# Patient Record
Sex: Female | Born: 1970 | Race: White | Hispanic: No | Marital: Married | Smoking: Never smoker
Health system: Southern US, Community
[De-identification: ages and names within clinical notes are randomized; demographics above are authoritative.]

## PROBLEM LIST (undated history)

## (undated) DIAGNOSIS — Z973 Presence of spectacles and contact lenses: Secondary | ICD-10-CM

## (undated) DIAGNOSIS — Z8659 Personal history of other mental and behavioral disorders: Secondary | ICD-10-CM

## (undated) DIAGNOSIS — N950 Postmenopausal bleeding: Secondary | ICD-10-CM

## (undated) DIAGNOSIS — S060XAA Concussion with loss of consciousness status unknown, initial encounter: Secondary | ICD-10-CM

## (undated) DIAGNOSIS — K219 Gastro-esophageal reflux disease without esophagitis: Secondary | ICD-10-CM

## (undated) DIAGNOSIS — I10 Essential (primary) hypertension: Secondary | ICD-10-CM

## (undated) DIAGNOSIS — F411 Generalized anxiety disorder: Secondary | ICD-10-CM

## (undated) DIAGNOSIS — F419 Anxiety disorder, unspecified: Secondary | ICD-10-CM

## (undated) HISTORY — DX: Concussion with loss of consciousness status unknown, initial encounter: S06.0XAA

## (undated) HISTORY — DX: Gastro-esophageal reflux disease without esophagitis: K21.9

## (undated) HISTORY — PX: KNEE SURGERY: SHX244

## (undated) HISTORY — PX: LUMBAR MICRODISCECTOMY: SHX99

## (undated) HISTORY — DX: Essential (primary) hypertension: I10

## (undated) HISTORY — PX: EYE SURGERY: SHX253

## (undated) HISTORY — PX: OTHER SURGICAL HISTORY: SHX169

## (undated) HISTORY — PX: WISDOM TOOTH EXTRACTION: SHX21

---

## 2010-12-20 ENCOUNTER — Encounter: Payer: Self-pay | Admitting: Emergency Medicine

## 2010-12-20 ENCOUNTER — Ambulatory Visit (INDEPENDENT_AMBULATORY_CARE_PROVIDER_SITE_OTHER): Payer: Self-pay | Admitting: Emergency Medicine

## 2010-12-20 DIAGNOSIS — J069 Acute upper respiratory infection, unspecified: Secondary | ICD-10-CM

## 2010-12-24 NOTE — Assessment & Plan Note (Signed)
Summary: SORE THROAT/ POSSIBLE STREP? (rm 4)   Vital Signs:  Patient Profile:   40 Years Old Female CC:      sore throat x yesterday Height:     61 inches Weight:      128 pounds O2 Sat:      98 % O2 treatment:    Room Air Temp:     99.6 degrees F oral Pulse rate:   111 / minute Resp:     18 per minute BP sitting:   122 / 82  (left arm) Cuff size:   regular  Vitals Entered By: Lajean Saver RN (December 20, 2010 11:52 AM)                  Updated Prior Medication List: No Medications Current Allergies: No known allergies History of Present Illness Chief Complaint: sore throat x yesterday History of Present Illness: 40 Years Old Female complains of onset of cold symptoms for 2 days, but has had a cold for a few weeks.  Was on Amoxil x10 days and stopped 1 week ago.  STEPHAINE has been using Zicam and other OTC cold meds which is helping a little bit. ++ sore throat (main complaint) No cough No pleuritic pain No wheezing No nasal congestion + post-nasal drainage No sinus pain/pressure No chest congestion No itchy/red eyes No earache No hemoptysis No SOB No chills/sweats No fever No nausea No vomiting No abdominal pain No diarrhea No skin rashes No fatigue No myalgias No headache   REVIEW OF SYSTEMS Constitutional Symptoms      Denies fever, chills, night sweats, weight loss, weight gain, and fatigue.  Eyes       Denies change in vision, eye pain, eye discharge, glasses, contact lenses, and eye surgery. Ear/Nose/Throat/Mouth       Complains of sore throat.      Denies hearing loss/aids, change in hearing, ear pain, ear discharge, dizziness, frequent runny nose, frequent nose bleeds, sinus problems, hoarseness, and tooth pain or bleeding.  Respiratory       Complains of dry cough.      Denies productive cough, wheezing, shortness of breath, asthma, bronchitis, and emphysema/COPD.  Cardiovascular       Denies murmurs, chest pain, and tires easily with  exhertion.    Gastrointestinal       Denies stomach pain, nausea/vomiting, diarrhea, constipation, blood in bowel movements, and indigestion. Genitourniary       Denies painful urination, kidney stones, and loss of urinary control. Neurological       Denies paralysis, seizures, and fainting/blackouts. Musculoskeletal       Denies muscle pain, joint pain, joint stiffness, decreased range of motion, redness, swelling, muscle weakness, and gout.  Skin       Denies bruising, unusual mles/lumps or sores, and hair/skin or nail changes.  Psych       Denies mood changes, temper/anger issues, anxiety/stress, speech problems, depression, and sleep problems.  Past History:  Past Medical History: recent bronchial infection  Past Surgical History: microdiscectomy   Social History: Never Smoked Alcohol use-yes Drug use-no Smoking Status:  never Drug Use:  no Physical Exam General appearance: well developed, well nourished, no acute distress, hoarse Ears: normal, no lesions or deformities Nasal: mucosa pink, nonedematous, no septal deviation, turbinates normal Oral/Pharynx: pharyngeal erythema without exudate, uvula midline without deviation.  Bil tonsils enlargement, OP patent Neck: ant cerv LAD tender Bil Thyroid: no nodules, masses, tenderness, or enlargement Chest/Lungs: no rales, wheezes, or rhonchi bilateral,  breath sounds equal without effort MSE: oriented to time, place, and person Assessment New Problems: PHARYNGITIS, ACUTE (ICD-462.0) UPPER RESPIRATORY INFECTION, ACUTE (ICD-465.9)   Patient Education: Patient and/or caregiver instructed in the following: rest, fluids.  Plan New Medications/Changes: BACTRIM DS 800-160 MG TABS (SULFAMETHOXAZOLE-TRIMETHOPRIM) 1 by mouth two times a day for 7 days  #14 x 0, 12/20/2010, Hoyt Koch MD PREDNISONE (PAK) 10 MG TABS (PREDNISONE) 6 day pack, use as directed  #1 x 0, 12/20/2010, Hoyt Koch MD  New Orders: New Patient  Level I [99201] Rapid Strep [82956] Planning Comments:   1)  Take the prescribed antibiotic as instructed.  Wait for 2 days to see if the Prednisone helps.  If not better, start the Bactrim. 2)  Use nasal saline solution (over the counter) at least 3 times a day. 3)  Use over the counter decongestants like Zyrtec-D every 12 hours as needed to help with congestion. 4)  Can take tylenol every 6 hours or motrin every 8 hours for pain or fever. 5)  Follow up with your primary doctor  if no improvement in 5-7 days, sooner if increasing pain, fever, or new symptoms.    The patient and/or caregiver has been counseled thoroughly with regard to medications prescribed including dosage, schedule, interactions, rationale for use, and possible side effects and they verbalize understanding.  Diagnoses and expected course of recovery discussed and will return if not improved as expected or if the condition worsens. Patient and/or caregiver verbalized understanding.  Prescriptions: BACTRIM DS 800-160 MG TABS (SULFAMETHOXAZOLE-TRIMETHOPRIM) 1 by mouth two times a day for 7 days  #14 x 0   Entered and Authorized by:   Hoyt Koch MD   Signed by:   Hoyt Koch MD on 12/20/2010   Method used:   Print then Give to Patient   RxID:   [id number] PREDNISONE (PAK) 10 MG TABS (PREDNISONE) 6 day pack, use as directed  #1 x 0   Entered and Authorized by:   Hoyt Koch MD   Signed by:   Hoyt Koch MD on 12/20/2010   Method used:   Print then Give to Patient   RxID:   [id number]   Orders Added: 1)  New Patient Level I [99201] 2)  Rapid Strep [[id number]]    Laboratory Results  Date/Time Received: December 20, 2010 11:54 AM  Date/Time Reported: December 20, 2010 11:54 AM   Other Tests  Rapid Strep: negative  Kit Test Internal QC: Negative   (Normal Range: Negative)

## 2015-08-07 DIAGNOSIS — Z87828 Personal history of other (healed) physical injury and trauma: Secondary | ICD-10-CM | POA: Insufficient documentation

## 2015-08-07 HISTORY — DX: Personal history of other (healed) physical injury and trauma: Z87.828

## 2015-11-14 HISTORY — PX: KNEE ARTHROSCOPY W/ ACL RECONSTRUCTION: SHX1858

## 2017-06-02 DIAGNOSIS — N83201 Unspecified ovarian cyst, right side: Secondary | ICD-10-CM | POA: Insufficient documentation

## 2018-08-26 ENCOUNTER — Other Ambulatory Visit: Payer: Self-pay

## 2018-08-26 ENCOUNTER — Emergency Department (INDEPENDENT_AMBULATORY_CARE_PROVIDER_SITE_OTHER)
Admission: EM | Admit: 2018-08-26 | Discharge: 2018-08-26 | Disposition: A | Discharge status: Home / Self Care | Payer: 59 | Source: Home / Self Care | Attending: Family Medicine | Admitting: Family Medicine

## 2018-08-26 ENCOUNTER — Telehealth: Payer: Self-pay

## 2018-08-26 ENCOUNTER — Emergency Department (INDEPENDENT_AMBULATORY_CARE_PROVIDER_SITE_OTHER): Payer: 59

## 2018-08-26 DIAGNOSIS — R1011 Right upper quadrant pain: Secondary | ICD-10-CM

## 2018-08-26 DIAGNOSIS — R93422 Abnormal radiologic findings on diagnostic imaging of left kidney: Secondary | ICD-10-CM

## 2018-08-26 DIAGNOSIS — R1012 Left upper quadrant pain: Secondary | ICD-10-CM | POA: Diagnosis not present

## 2018-08-26 LAB — POCT URINALYSIS DIP (MANUAL ENTRY)
BILIRUBIN UA: NEGATIVE mg/dL
Bilirubin, UA: NEGATIVE
Glucose, UA: NEGATIVE mg/dL
LEUKOCYTES UA: NEGATIVE
NITRITE UA: NEGATIVE
PH UA: 5.5 (ref 5.0–8.0)
PROTEIN UA: NEGATIVE mg/dL
Spec Grav, UA: 1.015 (ref 1.010–1.025)
UROBILINOGEN UA: 0.2 U/dL

## 2018-08-26 LAB — AMYLASE: Amylase: 51 U/L (ref 21–101)

## 2018-08-26 LAB — CBC WITH DIFFERENTIAL/PLATELET
BASOS ABS: 90 {cells}/uL (ref 0–200)
Basophils Relative: 0.7 %
EOS PCT: 13.8 %
Eosinophils Absolute: 1766 cells/uL — ABNORMAL HIGH (ref 15–500)
HEMATOCRIT: 43.9 % (ref 35.0–45.0)
Hemoglobin: 14.8 g/dL (ref 11.7–15.5)
LYMPHS ABS: 2829 {cells}/uL (ref 850–3900)
MCH: 30.6 pg (ref 27.0–33.0)
MCHC: 33.7 g/dL (ref 32.0–36.0)
MCV: 90.9 fL (ref 80.0–100.0)
MPV: 10.5 fL (ref 7.5–12.5)
Monocytes Relative: 4.4 %
NEUTROS PCT: 59 %
Neutro Abs: 7552 cells/uL (ref 1500–7800)
Platelets: 293 10*3/uL (ref 140–400)
RBC: 4.83 10*6/uL (ref 3.80–5.10)
RDW: 12.8 % (ref 11.0–15.0)
Total Lymphocyte: 22.1 %
WBC mixed population: 563 cells/uL (ref 200–950)
WBC: 12.8 10*3/uL — ABNORMAL HIGH (ref 3.8–10.8)

## 2018-08-26 LAB — COMPLETE METABOLIC PANEL WITH GFR
AG RATIO: 1.6 (calc) (ref 1.0–2.5)
ALT: 17 U/L (ref 6–29)
AST: 21 U/L (ref 10–35)
Albumin: 4.3 g/dL (ref 3.6–5.1)
Alkaline phosphatase (APISO): 72 U/L (ref 33–115)
BUN: 12 mg/dL (ref 7–25)
CALCIUM: 9.5 mg/dL (ref 8.6–10.2)
CO2: 26 mmol/L (ref 20–32)
Chloride: 101 mmol/L (ref 98–110)
Creat: 0.63 mg/dL (ref 0.50–1.10)
GFR, EST NON AFRICAN AMERICAN: 108 mL/min/{1.73_m2} (ref >=60)
GFR, Est African American: 126 mL/min/{1.73_m2} (ref >=60)
GLUCOSE: 93 mg/dL (ref 65–99)
Globulin: 2.7 g/dL (calc) (ref 1.9–3.7)
POTASSIUM: 4 mmol/L (ref 3.5–5.3)
Sodium: 138 mmol/L (ref 135–146)
Total Bilirubin: 0.4 mg/dL (ref 0.2–1.2)
Total Protein: 7 g/dL (ref 6.1–8.1)

## 2018-08-26 LAB — LIPASE: LIPASE: 19 U/L (ref 7–60)

## 2018-08-26 MED ORDER — METRONIDAZOLE 500 MG PO TABS: ORAL_TABLET | ORAL | 0 refills | Status: DC

## 2018-08-26 MED ORDER — CIPROFLOXACIN HCL 500 MG PO TABS: ORAL_TABLET | ORAL | 0 refills | Status: DC

## 2018-08-26 NOTE — ED Provider Notes (Signed)
[facility]    CSN: [id number] Arrival date & time: 08/26/18  1054     History   Chief Complaint Chief Complaint  Patient presents with  . Abdominal Pain    HPI Patricia Webb is a 48 y.o. female.   At about 8pm yesterday, one hour after eating pizza, patient developed mid abdominal pain that radiated to her lower back.  She described the pain as sharp, constant and somewhat colicky.  She took some Prilosec OTC and PeptoBismol with slight relief.  The pain is still present but has improved.  She has had mild nausea without vomiting.  She has had chills but no fever.  No urinary symptoms.  Bowel movements have been normal.  She has had vaginal spotting for about 9 days (she has the Mirena IUD for contraception), and normally has 4 periods/year.  She is concerned that she may have gallstones.  The history is provided by the patient.  Abdominal Pain  Pain location:  Epigastric Pain quality: sharp   Pain radiates to:  Back Pain severity:  Moderate Onset quality:  Sudden Duration:  12 hours Timing:  Constant Progression:  Improving Chronicity:  New Context: eating   Relieved by:  Nothing Worsened by:  Nothing Ineffective treatments:  OTC medications Associated symptoms: chills and nausea   Associated symptoms: no anorexia, no belching, no chest pain, no constipation, no cough, no diarrhea, no dysuria, no fatigue, no fever, no flatus, no hematemesis, no hematochezia, no hematuria, no melena, no shortness of breath and no vomiting     History reviewed. No pertinent past medical history.  There are no active problems to display for this patient.   Past Surgical History:  Procedure Laterality Date  . KNEE SURGERY Left   . microdiscectomy Bilateral     OB History   None      Home Medications    Prior to Admission medications   Medication Sig Start Date End Date Taking? Authorizing Provider  levonorgestrel (MIRENA) 20 MCG/24HR IUD 1 each by  Intrauterine route once.   Yes [provider]    Family History History reviewed. No pertinent family history.  Social History Social History   Tobacco Use  . Smoking status: Never Smoker  . Smokeless tobacco: Never Used  Substance Use Topics  . Alcohol use: Yes    Alcohol/week: 7.0 standard drinks    Types: 7 Glasses of wine per week  . Drug use: Never     Allergies   Patient has no known allergies.   Review of Systems Review of Systems  Constitutional: Positive for chills. Negative for fatigue and fever.  Respiratory: Negative for cough and shortness of breath.   Cardiovascular: Negative for chest pain.  Gastrointestinal: Positive for abdominal pain and nausea. Negative for anorexia, constipation, diarrhea, flatus, hematemesis, hematochezia, melena and vomiting.  Genitourinary: Negative for dysuria and hematuria.  All other systems reviewed and are negative.    Physical Exam Triage Vital Signs ED Triage Vitals  Enc Vitals Group     BP 08/26/18 1207 (!) 161/108     Pulse Rate 08/26/18 1207 71     Resp --      Temp 08/26/18 1207 98.3 F (36.8 C)     Temp Source 08/26/18 1207 Oral     SpO2 --      Weight 08/26/18 1209 158 lb (71.7 kg)     Height 08/26/18 1209 5\' 2"  (1.575 m)     Head Circumference --  Peak Flow --      Pain Score 08/26/18 1208 3     Pain Loc --      Pain Edu? --      Excl. in GC? --    No data found.  Updated Vital Signs BP (!) 161/108 (BP Location: Right Arm)   Pulse 71   Temp 98.3 F (36.8 C) (Oral)   Ht 5\' 2"  (1.575 m)   Wt 71.7 kg   BMI 28.90 kg/m   Visual Acuity Right Eye Distance:   Left Eye Distance:   Bilateral Distance:    Right Eye Near:   Left Eye Near:    Bilateral Near:     Physical Exam  Constitutional: She appears well-developed and well-nourished. She does not appear ill. No distress.  HENT:  Head: Normocephalic.  Right Ear: External ear normal.  Left Ear: External ear normal.  Nose: Nose  normal.  Mouth/Throat: Oropharynx is clear and moist.  Eyes: Pupils are equal, round, and reactive to light. Conjunctivae are normal.  Neck: Neck supple.  Cardiovascular: Normal heart sounds.  Pulmonary/Chest: Breath sounds normal.  Abdominal: Soft. Normal appearance and bowel sounds are normal. She exhibits no mass. There is no hepatosplenomegaly. There is tenderness in the right upper quadrant, periumbilical area and left upper quadrant. There is no rigidity, no guarding, no CVA tenderness, no tenderness at McBurney's point and negative Murphy's sign.    Vague abdominal tenderness to palpation as noted on diagram.   Musculoskeletal: She exhibits no edema.  Lymphadenopathy:    She has no cervical adenopathy.  Neurological: She is alert.  Skin: Skin is warm and dry. No rash noted.  Nursing note and vitals reviewed.    UC Treatments / Results  Labs (all labs ordered are listed, but only abnormal results are displayed) Labs Reviewed  POCT URINALYSIS DIP (MANUAL ENTRY) - Abnormal; Notable for the following components:      Result Value   Clarity, UA cloudy (*)    Blood, UA small (*)    All other components within normal limits  COMPLETE METABOLIC PANEL WITH GFR  AMYLASE  LIPASE  CBC WITH DIFFERENTIAL/PLATELET  POCT CBC W AUTO DIFF ([facility])    EKG None  Radiology No results found.  Procedures Procedures (including critical care time)  Medications Ordered in UC Medications - No data to display  Initial Impression / Assessment and Plan / UC Course  I have reviewed the triage vital signs and the nursing notes.  Pertinent labs & imaging results that were available during my care of the patient were reviewed by me and considered in my medical decision making (see chart for details).     Abdominal US shows no gallstones or gallbladder wall thickening.  Note that left kidney smaller than right which could represent renal artery stenosis in light of her elevated  BP.  Recommend follow-up with PCP for further evaluation of BP.  CMP, amylase, lipase pending.   Patient left clinic after ultrasound, and before CBC results available.  Mild leukocytosis (WBC 12.8) suggests possible diverticulitis.   Will begin empiric Flagyl and Cipro.  Patient instructed to begin clear liquid diet for about 24 hours, then slowly advance. She should follow-up with PCP for further evaluation. If symptoms become significantly worse during the night or over the weekend, proceed to the local emergency room.   Final Clinical Impressions(s) / UC Diagnoses   Final diagnoses:  Left upper quadrant pain  Right upper quadrant abdominal pain  Discharge Instructions   None    ED Prescriptions    None         Lattie Haw, MD 08/26/18 [phone]

## 2018-08-26 NOTE — ED Triage Notes (Signed)
Pt presents with mid abdominal pain radiating to Bilateral posterior back. She describes pain as sharp 3/10 scale and it started last night after eating pizza. Mrs. Borski took otc omeprazole and peptobismol with slight relief. Upon arriving at work this morning the pain has worsened and she decided to come to [facility]. Of note, pt has some vaginal spotting for 9 days due to Mirena [facility]), she normally has 4 periods/year.

## 2018-08-26 NOTE — Telephone Encounter (Signed)
Pt called to ask for results of [place] and labs.  Dr Cathren Harsh wants her on clear liquids x 24 hours then progress to a BRAT diet for another 24 hours.  Slowly progress to a normal diet as tolerated.  Dr Cathren Harsh will send in medication for possible diverticulitis, and instructions if she becomes significantly worse overnight or this weekend to proceed to ER for scan.  Needs to follow up with a primary care provider early next week.

## 2018-08-26 NOTE — ED Triage Notes (Signed)
Pt had to leave before U/S resulted due to another appointment. Dr. Cathren Harsh will contact pt with results later. Pt was instructed to follow with a light diet and continue Prilosec.

## 2018-08-26 NOTE — Discharge Instructions (Addendum)
May continue Prilosec for now.

## 2018-08-27 ENCOUNTER — Telehealth: Payer: Self-pay

## 2018-08-27 NOTE — Telephone Encounter (Signed)
Pt called asking about bloodwork results. Informed of slight abnormal CBC. Encouraged to finish antibiotics and establish with PCP for further eval.

## 2018-08-29 ENCOUNTER — Telehealth: Payer: Self-pay | Admitting: Emergency Medicine

## 2018-08-29 NOTE — Telephone Encounter (Signed)
Attempted to contact x1. Left message to return call. 

## 2018-09-09 ENCOUNTER — Ambulatory Visit (INDEPENDENT_AMBULATORY_CARE_PROVIDER_SITE_OTHER): Payer: 59 | Admitting: Osteopathic Medicine

## 2018-09-09 ENCOUNTER — Encounter: Payer: Self-pay | Admitting: Osteopathic Medicine

## 2018-09-09 VITALS — BP 142/85 | HR 78 | Temp 98.6°F | Ht 62.0 in | Wt 161.2 lb

## 2018-09-09 DIAGNOSIS — F410 Panic disorder [episodic paroxysmal anxiety] without agoraphobia: Secondary | ICD-10-CM | POA: Insufficient documentation

## 2018-09-09 DIAGNOSIS — R109 Unspecified abdominal pain: Secondary | ICD-10-CM | POA: Insufficient documentation

## 2018-09-09 MED ORDER — ALPRAZOLAM 0.5 MG PO TABS: 0.2500 mg | ORAL_TABLET | Freq: Two times a day (BID) | ORAL | 0 refills | Status: DC | PRN

## 2018-09-09 MED ORDER — DICYCLOMINE HCL 10 MG PO CAPS: 10.0000 mg | ORAL_CAPSULE | Freq: Four times a day (QID) | ORAL | 0 refills | Status: DC | PRN

## 2018-09-09 NOTE — Progress Notes (Addendum)
HPI: Patricia Webb is a 47 y.o. female who  has no past medical history on file.  she presents to [facility] today, 09/09/18,  for chief complaint of: New to establish care Concern for diverticulitis flare, UC follow-up   Reports diarrhea with recent antibiotic treatment but abdominal pain has resolved.  She states abdominal pain was in a sharp band around upper abdomen, radiating a bit into the back.  She did not have any diarrhea prior to starting the antibiotics.  See below for review of urgent care notes.  Available notes reviewed: Recent UC visit 08/26/2018, 2 weeks ago -concerned about abdominal pain radiating to lower back day prior to presentation, pain had improved by time of visit but was still present, associated with mild nausea with no vomiting, chills but no fever, normal bowel movements.  Blood pressure was elevated in urgent care 161/108, abdominal ultrasound showed no gallstones or gallbladder wall thickening, left kidney smaller than right which could represent RAS in light of elevated BP recommended follow-up with PCP (BP is better today on intake at 142/85).  CBC showed mild leukocytosis at white blood cells 12.8 suggesting possible diverticulitis, began empiric Flagyl/Cipro and instructed on clear liquid diet.  No concerns on CMP, amylase or lipase.  There was small blood in urine.  Patient reports that on exam she had some left lower quadrant tenderness but this area was not bothering her prior to palpation by the physician  Hx anxiety -seems to be getting worse over the past couple of months.  Not a daily issue sometimes she just feels more on edge and then will feel better without any intervention.  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  Recent issues with niece suicide attempt have caused increased stress in her family.  Blood pressure was  elevated in urgent care, better today.   Past medical, surgical, social and family history reviewed:  There are no active problems to display for this patient.   Past Surgical History:  Procedure Laterality Date  . KNEE SURGERY Left   . microdiscectomy Bilateral     Social History   Tobacco Use  . Smoking status: Never Smoker  . Smokeless tobacco: Never Used  Substance Use Topics  . Alcohol use: Yes    Alcohol/week: 7.0 standard drinks    Types: 7 Glasses of wine per week    No family history on file.   Current medication list and allergy/intolerance information reviewed:    No medications at time of visit other than birth control, husband is planning for a vasectomy before the end of the year  No Known Allergies    Review of Systems:  Constitutional:  No  fever, no chills, +recent illness, No unintentional weight changes. No significant fatigue.   HEENT: No  headache, no vision change, no hearing change, No sore throat, No  sinus pressure  Cardiac: No  chest pain, No  pressure, No palpitations, No  Orthopnea  Respiratory:  No  shortness of breath. No  Cough  Gastrointestinal: +abdominal pain - has resolved, No  nausea, No  vomiting,  No  blood in stool, No  diarrhea, No  constipation   Musculoskeletal: No new myalgia/arthralgia  Skin: No  Rash, No other wounds/concerning lesions  Genitourinary: No  incontinence, No  abnormal genital bleeding, No abnormal genital discharge  Hem/Onc: No  easy bruising/bleeding, No  abnormal lymph node  Endocrine: No cold intolerance,  No heat intolerance. No polyuria/polydipsia/polyphagia   Neurologic: No  weakness, No  dizziness, No  slurred speech/focal weakness/facial droop  Psychiatric: No  concerns with depression, No  concerns with anxiety, No sleep problems, No mood problems  Exam:  BP (!) 142/85 (BP Location: Left Arm, Patient Position: Sitting, Cuff Size: Normal)   Pulse 78   Temp 98.6 F (37 C) (Oral)   Ht 5'  2" (1.575 m)   Wt 161 lb 3.2 oz (73.1 kg)   LMP 08/26/2018   BMI 29.48 kg/m   Constitutional: VS see above. General Appearance: alert, well-developed, well-nourished, NAD  Eyes: Normal lids and conjunctive, non-icteric sclera  Ears, Nose, Mouth, Throat: MMM, Normal external inspection ears/nares/mouth/lips/gums.   Neck: No masses, trachea midline.   Respiratory: Normal respiratory effort. no wheeze, no rhonchi, no rales  Cardiovascular: S1/S2 normal, no murmur, no rub/gallop auscultated. RRR. No lower extremity edema.   Gastrointestinal: Nontender, no masses. No hepatomegaly, no splenomegaly. No hernia appreciated. Bowel sounds normal. Rectal exam deferred.   Musculoskeletal: Gait normal. No clubbing/cyanosis of digits.   Neurological: Normal balance/coordination. No tremor. No cranial nerve deficit on limited exam.   Skin: warm, dry, intact. No rash/ulcer.   Psychiatric: Normal judgment/insight. Normal mood and affect. Oriented x3.      ASSESSMENT/PLAN:   Abdominal spasms - Symptoms resolved, no apparent diverticular illness at this point.  Patient given Bentyl to take as needed.  Panic disorder (episodic paroxysmal anxiety) - 15 tablets of alprazolam prescribed, patient was advised on sparing use of this medication for panic symptoms only as needed.   Elevated blood pressure: Patient educated on goal blood pressure systolic 130 or less, diastolic 80 or less.  Patient has a family member who is a Engineer, civil (consulting)nurse and can check her blood pressure at home, patient is advised to let me know what pressures are looking like.   Meds ordered this encounter  Medications  . dicyclomine (BENTYL) 10 MG capsule    Sig: Take 1-2 capsules (10-20 mg total) by mouth 4 (four) times daily as needed (abdominal pain/spasm).    Dispense:  60 capsule    Refill:  0  . ALPRAZolam (XANAX) 0.5 MG tablet    Sig: Take 0.5-1 tablets (0.25-0.5 mg total) by mouth 2 (two) times daily as needed for anxiety.     Dispense:  15 tablet    Refill:  0     Visit summary with medication list and pertinent instructions was printed for patient to review. All questions at time of visit were answered - patient instructed to contact office with any additional concerns. ER/RTC precautions were reviewed with the patient.   Follow-up plan: Return sooner if needed (especially if anxiety is an issue) , for annual physical 05/2019 when due.     Please note: voice recognition software was used to produce this document, and typos may escape review. Please contact Dr. Lyn HollingsheadAlexander for any needed clarifications.

## 2018-10-08 ENCOUNTER — Emergency Department
Admission: EM | Admit: 2018-10-08 | Discharge: 2018-10-08 | Disposition: A | Discharge status: Home / Self Care | Payer: Commercial Managed Care - PPO | Source: Home / Self Care | Attending: Family Medicine | Admitting: Family Medicine

## 2018-10-08 ENCOUNTER — Encounter: Payer: Self-pay | Admitting: *Deleted

## 2018-10-08 DIAGNOSIS — R21 Rash and other nonspecific skin eruption: Secondary | ICD-10-CM | POA: Diagnosis not present

## 2018-10-08 MED ORDER — CEPHALEXIN 500 MG PO CAPS: 500.0000 mg | ORAL_CAPSULE | Freq: Two times a day (BID) | ORAL | 0 refills | Status: DC

## 2018-10-08 MED ORDER — HYDROXYZINE HCL 25 MG PO TABS: 25.0000 mg | ORAL_TABLET | Freq: Every day | ORAL | 0 refills | Status: DC

## 2018-10-08 MED ORDER — CETIRIZINE HCL 10 MG PO TABS: 10.0000 mg | ORAL_TABLET | Freq: Every day | ORAL | 0 refills | Status: DC

## 2018-10-08 MED ORDER — PREDNISONE 20 MG PO TABS: ORAL_TABLET | ORAL | 0 refills | Status: DC

## 2018-10-08 NOTE — ED Provider Notes (Signed)
Patricia Webb CARE    CSN: [id number] Arrival date & time: 10/08/18  8657     History   Chief Complaint Chief Complaint  Patient presents with  . Rash    HPI Patricia Webb is a 47 y.o. female.   HPI Patricia Webb is a 47 y.o. female presenting to UC with c/o itchy rash to her scalp, neck, chest, abdomen and back for about 1 week. She has tried oral and topical benadryl with mild relief as well as hydrocortisone cream with no relief last night. She initially thought the rash was due to new sheets but rash persisted after washing her sheets. No other changes she can recall. Denies rash on her arms, hands, or lower extremities.  Her husband does not have a rash. No other symptoms.   History reviewed. No pertinent past medical history.  Patient Active Problem List   Diagnosis Date Noted  . Panic disorder (episodic paroxysmal anxiety) 09/09/2018  . Abdominal spasms 09/09/2018    Past Surgical History:  Procedure Laterality Date  . KNEE SURGERY Left   . microdiscectomy Bilateral     OB History   No obstetric history on file.      Home Medications    Prior to Admission medications   Medication Sig Start Date End Date Taking? Authorizing Provider  ALPRAZolam Prudy Feeler) 0.5 MG tablet Take 0.5-1 tablets (0.25-0.5 mg total) by mouth 2 (two) times daily as needed for anxiety. 09/09/18   Sunnie Nielsen, DO  cephALEXin (KEFLEX) 500 MG capsule Take 1 capsule (500 mg total) by mouth 2 (two) times daily. 10/08/18   Lurene Shadow, PA-C  cetirizine (ZYRTEC) 10 MG tablet Take 1 tablet (10 mg total) by mouth daily. 10/08/18   Lurene Shadow, PA-C  dicyclomine (BENTYL) 10 MG capsule Take 1-2 capsules (10-20 mg total) by mouth 4 (four) times daily as needed (abdominal pain/spasm). 09/09/18   Sunnie Nielsen, DO  hydrOXYzine (ATARAX/VISTARIL) 25 MG tablet Take 1 tablet (25 mg total) by mouth at bedtime. 10/08/18   Lurene Shadow, PA-C  predniSONE (DELTASONE) 20 MG  tablet 3 tabs po day one, then 2 po daily x 4 days 10/08/18   Lurene Shadow, PA-C    Family History Family History  Problem Relation Age of Onset  . Healthy Mother   . Healthy Father     Social History Social History   Tobacco Use  . Smoking status: Never Smoker  . Smokeless tobacco: Never Used  Substance Use Topics  . Alcohol use: Yes    Alcohol/week: 7.0 standard drinks    Types: 7 Glasses of wine per week  . Drug use: Never     Allergies   Patient has no known allergies.   Review of Systems Review of Systems  Constitutional: Negative for chills and fever.  HENT: Negative for facial swelling and sore throat.   Respiratory: Negative for shortness of breath and wheezing.   Musculoskeletal: Negative for arthralgias and myalgias.  Skin: Positive for rash.     Physical Exam Triage Vital Signs ED Triage Vitals  Enc Vitals Group     BP 10/08/18 0833 (!) 140/92     Pulse Rate 10/08/18 0833 95     Resp 10/08/18 0833 16     Temp 10/08/18 0833 97.9 F (36.6 C)     Temp Source 10/08/18 0833 Oral     SpO2 10/08/18 0833 97 %     Weight 10/08/18 0834 150 lb (68 kg)  Height 10/08/18 0834 5\' 2"  (1.575 m)     Head Circumference --      Peak Flow --      Pain Score 10/08/18 0834 0     Pain Loc --      Pain Edu? --      Excl. in GC? --    No data found.  Updated Vital Signs BP (!) 140/92 (BP Location: Right Arm)   Pulse 95   Temp 97.9 F (36.6 C) (Oral)   Resp 16   Ht 5\' 2"  (1.575 m)   Wt 150 lb (68 kg)   SpO2 97%   BMI 27.44 kg/m   Visual Acuity Right Eye Distance:   Left Eye Distance:   Bilateral Distance:    Right Eye Near:   Left Eye Near:    Bilateral Near:     Physical Exam Vitals signs and nursing note reviewed.  Constitutional:      Appearance: She is well-developed.  HENT:     Head: Normocephalic and atraumatic.  Neck:     Musculoskeletal: Normal range of motion.  Cardiovascular:     Rate and Rhythm: Normal rate.  Pulmonary:      Effort: Pulmonary effort is normal.  Musculoskeletal: Normal range of motion.  Skin:    General: Skin is warm and dry.     Findings: Erythema and rash present.     Comments: Diffuse erythematous papular rash on trunk with areas of excoriation, worse on lower back and abdomen.  No lesions on hands.   Neurological:     Mental Status: She is alert and oriented to person, place, and time.  Psychiatric:        Behavior: Behavior normal.      UC Treatments / Results  Labs (all labs ordered are listed, but only abnormal results are displayed) Labs Reviewed - No data to display  EKG None  Radiology No results found.  Procedures Procedures (including critical care time)  Medications Ordered in UC Medications - No data to display  Initial Impression / Assessment and Plan / UC Course  I have reviewed the triage vital signs and the nursing notes.  Pertinent labs & imaging results that were available during my care of the patient were reviewed by me and considered in my medical decision making (see chart for details).     Non-specific rash Will tx for allergic reaction and secondary skin infection due to multiple areas of excoriation. Doubt scabies- no track lines or hand lesions  Will have pt try trial of prednisone, pt declined IM injection Encouraged f/u as needed  Final Clinical Impressions(s) / UC Diagnoses   Final diagnoses:  Rash and nonspecific skin eruption     Discharge Instructions      Atarax (hydroxizine) is an antihistamine that can be taken to help with itching. This medication can cause drowsiness so do not drive or drink alcohol while taking.    You may take the cetirizine antihistamine during the day as it should not cause drowsiness, however, if it does, please use caution with driving or operating heavy machinery.    ED Prescriptions    Medication Sig Dispense Auth. Provider   predniSONE (DELTASONE) 20 MG tablet 3 tabs po day one, then 2 po daily  x 4 days 11 tablet Cathalina Barcia O, PA-C   cetirizine (ZYRTEC) 10 MG tablet Take 1 tablet (10 mg total) by mouth daily. 30 tablet Waylan RocherPhelps, Saman Umstead O, PA-C   hydrOXYzine (ATARAX/VISTARIL) 25 MG tablet  Take 1 tablet (25 mg total) by mouth at bedtime. 12 tablet Doroteo Glassman, Ritik Stavola O, PA-C   cephALEXin (KEFLEX) 500 MG capsule Take 1 capsule (500 mg total) by mouth 2 (two) times daily. 14 capsule Lurene Shadow, PA-C     Controlled Substance Prescriptions [place] Controlled Substance Registry consulted? Not Applicable   Rolla Plate 10/08/18 9562

## 2018-10-08 NOTE — ED Triage Notes (Signed)
Rash to torso, neck, and scalp, x1 week. C/o intense itching.

## 2018-10-08 NOTE — Discharge Instructions (Signed)
°  Atarax (hydroxizine) is an antihistamine that can be taken to help with itching. This medication can cause drowsiness so do not drive or drink alcohol while taking.    You may take the cetirizine antihistamine during the day as it should not cause drowsiness, however, if it does, please use caution with driving or operating heavy machinery.

## 2018-10-16 ENCOUNTER — Other Ambulatory Visit: Payer: Self-pay | Admitting: Osteopathic Medicine

## 2018-12-09 ENCOUNTER — Telehealth: Payer: Self-pay

## 2018-12-09 NOTE — Telephone Encounter (Signed)
Would agree that she certainly needs evaluation (possible HTN urgency w/ neuro symptoms)

## 2018-12-09 NOTE — Telephone Encounter (Signed)
Patricia Webb called and states she has been having elevated blood pressures at home. 160-100. She states while in the shower last night she had a loss of consciousness. I advised her to have someone take her to the ED.

## 2018-12-13 ENCOUNTER — Ambulatory Visit (INDEPENDENT_AMBULATORY_CARE_PROVIDER_SITE_OTHER): Payer: Commercial Managed Care - PPO | Admitting: Osteopathic Medicine

## 2018-12-13 VITALS — BP 175/107 | HR 73 | Temp 98.1°F | Wt 161.3 lb

## 2018-12-13 DIAGNOSIS — R55 Syncope and collapse: Secondary | ICD-10-CM

## 2018-12-13 DIAGNOSIS — I10 Essential (primary) hypertension: Secondary | ICD-10-CM | POA: Diagnosis not present

## 2018-12-13 DIAGNOSIS — F410 Panic disorder [episodic paroxysmal anxiety] without agoraphobia: Secondary | ICD-10-CM

## 2018-12-13 DIAGNOSIS — R002 Palpitations: Secondary | ICD-10-CM

## 2018-12-13 MED ORDER — PROPRANOLOL HCL ER 60 MG PO CP24: 60.0000 mg | ORAL_CAPSULE | Freq: Every day | ORAL | 1 refills | Status: DC

## 2018-12-13 NOTE — Progress Notes (Signed)
HPI: Patricia Webb is a 48 y.o. female who  has no past medical history on file.  she presents to [facility] today, 12/13/18,  for chief complaint of: HTN  . Called the office 4 days ago, concern for elevated blood pressures, episode of LOC in the shower the night prior to her call, blood pressures 1 60-100.  Was advised by triage to go to the emergency department and I agreed with assessment - eval for hypertensive urgency with neurological symptoms. She did not go to the ER since she had this appointment today. She states she was having a hot bath and stood up and felt faint, sat back down and was "out" for a short time. Reports occasional random periods of heart thumping / palpitations, no SOB or CP.  Marland Kitchen She brought blood pressure measurements with her today, see photo below, she came back later in the day to verify home BP monitor and it was accurate.  . Elevated home blood pressure measurements, associated with headaches, dizziness, very anxious, feeling high strong, palpitations. . Very anxious today. On intake, she asked if blood work was needed, the nurse said very likely yes, and patient was visibly upset, had to call her husband in to the office.      BP Readings from Last 3 Encounters:  10/08/18 (!) 140/92 - urgent care   09/09/18 (!) 142/85 - initial visit [facility]  08/26/18 (!) 161/108 - urgent care               Past medical, surgical, social and family history reviewed:  Patient Active Problem List   Diagnosis Date Noted  . Panic disorder (episodic paroxysmal anxiety) 09/09/2018  . Abdominal spasms 09/09/2018    Past Surgical History:  Procedure Laterality Date  . KNEE SURGERY Left   . microdiscectomy Bilateral     Social History   Tobacco Use  . Smoking status: Never Smoker  . Smokeless tobacco: Never Used  Substance Use Topics  . Alcohol use: Yes    Alcohol/week: 7.0 standard drinks    Types: 7 Glasses of wine  per week    Family History  Problem Relation Age of Onset  . Healthy Mother   . Healthy Father      Current medication list and allergy/intolerance information reviewed:    Current Outpatient Medications  Medication Sig Dispense Refill  . ALPRAZolam (XANAX) 0.5 MG tablet Take 0.5-1 tablets (0.25-0.5 mg total) by mouth 2 (two) times daily as needed for anxiety. 15 tablet 0  . cephALEXin (KEFLEX) 500 MG capsule Take 1 capsule (500 mg total) by mouth 2 (two) times daily. (Patient not taking: Reported on 12/13/2018) 14 capsule 0  . cetirizine (ZYRTEC) 10 MG tablet Take 1 tablet (10 mg total) by mouth daily. (Patient not taking: Reported on 12/13/2018) 30 tablet 0  . dicyclomine (BENTYL) 10 MG capsule TAKE 1-2 CAPSULES (10-20 MG TOTAL) BY MOUTH 4 (FOUR) TIMES DAILY AS NEEDED (ABDOMINAL PAIN/SPASM). (Patient not taking: Reported on 12/13/2018) 60 capsule 0  . hydrOXYzine (ATARAX/VISTARIL) 25 MG tablet Take 1 tablet (25 mg total) by mouth at bedtime. (Patient not taking: Reported on 12/13/2018) 12 tablet 0  . predniSONE (DELTASONE) 20 MG tablet 3 tabs po day one, then 2 po daily x 4 days (Patient not taking: Reported on 12/13/2018) 11 tablet 0  . propranolol ER (INDERAL LA) 60 MG 24 hr capsule Take 1 capsule (60 mg total) by mouth at bedtime. 30 capsule 1   No  current facility-administered medications for this visit.     No Known Allergies    Review of Systems:  Constitutional:  No  fever, no chills,  No unintentional weight changes. No significant fatigue.   HEENT: No  headache, no vision change, no hearing change, No sore throat, No  sinus pressure  Cardiac: No  chest pain, No  pressure, +palpitations, No  Orthopnea, +syncope as per HPI  Respiratory:  No  shortness of breath. No  Cough  Gastrointestinal: No  abdominal pain, No  nausea  Musculoskeletal: No new myalgia/arthralgia  Skin: No  Rash  Hem/Onc: No  easy bruising/bleeding  Endocrine: No cold intolerance,  No heat  intolerance.  Neurologic: No  weakness, No  dizziness, No  slurred speech/focal weakness/facial droop  Psychiatric: No  concerns with depression, +concerns with anxiety, No sleep problems, No mood problems   Exam:  BP (!) 175/107 (BP Location: Left Arm, Patient Position: Sitting, Cuff Size: Normal)   Pulse 73   Temp 98.1 F (36.7 C) (Oral)   Wt 161 lb 4.8 oz (73.2 kg)   BMI 29.50 kg/m   Constitutional: VS see above. General Appearance: alert, well-developed, well-nourished, NAD  Eyes: Normal lids and conjunctive, non-icteric sclera, EOMI, PERRL   Ears, Nose, Mouth, Throat: MMM, Normal external inspection ears/nares/mouth/lips/gums.   Neck: No masses, trachea midline. No thyroid enlargement. No tenderness/mass appreciated. No lymphadenopathy  Respiratory: Normal respiratory effort. no wheeze, no rhonchi, no rales  Cardiovascular: S1/S2 normal, no murmur, no rub/gallop auscultated. RRR. No lower extremity edema. Pedal pulse II/IV bilaterally DP and PT. No carotid bruit or JVD. No abdominal aortic bruit.  Gastrointestinal: Nontender, no masses.  Musculoskeletal: Gait normal. No clubbing/cyanosis of digits.   Neurological: Normal balance/coordination. No tremor. No cranial nerve deficit on limited exam. Motor and sensation intact and symmetric. Cerebellar reflexes intact.   Skin: warm, dry, intact. No rash/ulcer.  Psychiatric: Normal judgment/insight. Anxious mood and affect. Oriented x3.     Orthostatic VS for the past 24 hrs:  BP- Lying Pulse- Lying BP- Sitting Pulse- Sitting BP- Standing at 0 minutes Pulse- Standing at 0 minutes  12/13/18 1027 (!) 181/113 80 (!) 180/109 75 (!) 174/107 80     EKG interpretation: Rate: 81 Rhythm: sinus No ST/T changes concerning for acute ischemia/infarct  Previous EKG no tracings available           ASSESSMENT/PLAN: The primary encounter diagnosis was Hypertension, unspecified type. Diagnoses of Panic disorder (episodic  paroxysmal anxiety), Syncope, unspecified syncope type, and Palpitation were also pertinent to this visit.   Abdominal ultrasound 07/2018 did show left kidney smaller than right, possible renal artery stenosis.  No ahead and initiate work-up for this.  Orders Placed This Encounter  Procedures  . MR Abdomen W Wo Contrast  . CBC  . COMPLETE METABOLIC PANEL WITH GFR  . TSH  . EKG 12-Lead  . ECHOCARDIOGRAM COMPLETE    Meds ordered this encounter  Medications  . propranolol ER (INDERAL LA) 60 MG 24 hr capsule    Sig: Take 1 capsule (60 mg total) by mouth at bedtime.    Dispense:  30 capsule    Refill:  1    Patient Instructions  The episode of passing out sounds like your blood pressure actually dropped, due to immersion in hot water and standing up quickly.  This is something called orthostatic hypotension.  I am not so worried about this being a result of high blood pressure causing passing out, but if you experience  further episodes, please let me know.  We will go ahead and get an MRI of the abdomen to evaluate for a condition called renal artery stenosis which can cause high blood pressure.  You should get a call to schedule this.  Will likely be done on a Monday at the same building but my office is in.  We will also get an echocardiogram, which is an ultrasound to evaluate the structure and function of the heart.  You should get a call to schedule this.  This will be done at the med center in [facility].  Blood work today to evaluate for high blood pressure as well as other potential causes of palpitations.   Will start a medication today called propranolol.  This is drug in a class called beta-blockers, these can help treat palpitations and also potentially help treat anxiety.  We will start at a low dose and go up gradually depending on how you respond to the medicines.  If the MRI is positive for renal artery stenosis, we may add a different class of medications.   Please bring  home blood pressure cuff into the office to get this verified against our equipment here.          Visit summary with medication list and pertinent instructions was printed for patient to review. All questions at time of visit were answered - patient instructed to contact office with any additional concerns or updates. ER/RTC precautions were reviewed with the patient.     Please note: voice recognition software was used to produce this document, and typos may escape review. Please contact Dr. Lyn Hollingshead for any needed clarifications.     Follow-up plan: Return in about 1 week (around 12/20/2018) for Recheck blood pressure 1 to 2 days after MRI, go over MRI results.

## 2018-12-13 NOTE — Patient Instructions (Addendum)
The episode of passing out sounds like your blood pressure actually dropped, due to immersion in hot water and standing up quickly.  This is something called orthostatic hypotension.  I am not so worried about this being a result of high blood pressure causing passing out, but if you experience further episodes, please let me know.  We will go ahead and get an MRI of the abdomen to evaluate for a condition called renal artery stenosis which can cause high blood pressure.  You should get a call to schedule this.  Will likely be done on a Monday at the same building but my office is in.  We will also get an echocardiogram, which is an ultrasound to evaluate the structure and function of the heart.  You should get a call to schedule this.  This will be done at the med center in [facility].  Blood work today to evaluate for high blood pressure as well as other potential causes of palpitations.   Will start a medication today called propranolol.  This is drug in a class called beta-blockers, these can help treat palpitations and also potentially help treat anxiety.  We will start at a low dose and go up gradually depending on how you respond to the medicines.  If the MRI is positive for renal artery stenosis, we may add a different class of medications.   Please bring home blood pressure cuff into the office to get this verified against our equipment here.

## 2018-12-14 LAB — COMPLETE METABOLIC PANEL WITH GFR
AG Ratio: 1.4 (calc) (ref 1.0–2.5)
ALT: 17 U/L (ref 6–29)
AST: 19 U/L (ref 10–35)
Albumin: 4.3 g/dL (ref 3.6–5.1)
Alkaline phosphatase (APISO): 60 U/L (ref 31–125)
BILIRUBIN TOTAL: 0.3 mg/dL (ref 0.2–1.2)
BUN: 9 mg/dL (ref 7–25)
CHLORIDE: 105 mmol/L (ref 98–110)
CO2: 24 mmol/L (ref 20–32)
CREATININE: 0.62 mg/dL (ref 0.50–1.10)
Calcium: 10.1 mg/dL (ref 8.6–10.2)
GFR, EST AFRICAN AMERICAN: 126 mL/min/{1.73_m2} (ref >=60)
GFR, Est Non African American: 109 mL/min/{1.73_m2} (ref >=60)
GLUCOSE: 86 mg/dL (ref 65–99)
Globulin: 3 g/dL (calc) (ref 1.9–3.7)
Potassium: 4 mmol/L (ref 3.5–5.3)
Sodium: 140 mmol/L (ref 135–146)
TOTAL PROTEIN: 7.3 g/dL (ref 6.1–8.1)

## 2018-12-14 LAB — CBC
HCT: 43.5 % (ref 35.0–45.0)
HEMOGLOBIN: 15.1 g/dL (ref 11.7–15.5)
MCH: 31.3 pg (ref 27.0–33.0)
MCHC: 34.7 g/dL (ref 32.0–36.0)
MCV: 90.1 fL (ref 80.0–100.0)
MPV: 10.1 fL (ref 7.5–12.5)
PLATELETS: 324 10*3/uL (ref 140–400)
RBC: 4.83 10*6/uL (ref 3.80–5.10)
RDW: 13.2 % (ref 11.0–15.0)
WBC: 7.3 10*3/uL (ref 3.8–10.8)

## 2018-12-14 LAB — TSH: TSH: 1.63 mIU/L

## 2018-12-27 ENCOUNTER — Ambulatory Visit: Payer: Commercial Managed Care - PPO

## 2018-12-27 ENCOUNTER — Telehealth: Payer: Self-pay

## 2018-12-27 ENCOUNTER — Ambulatory Visit (INDEPENDENT_AMBULATORY_CARE_PROVIDER_SITE_OTHER): Payer: Commercial Managed Care - PPO | Admitting: Sports Medicine

## 2018-12-27 DIAGNOSIS — I878 Other specified disorders of veins: Secondary | ICD-10-CM | POA: Insufficient documentation

## 2018-12-27 MED ORDER — GADOBENATE DIMEGLUMINE 529 MG/ML IV SOLN
10.0000 mL | Freq: Once | INTRAVENOUS | Status: AC | PRN
  Administered 2018-12-27: 10 mL via INTRAVENOUS

## 2018-12-27 MED ORDER — DIAZEPAM 5 MG PO TABS: 5.0000 mg | ORAL_TABLET | Freq: Once | ORAL | 0 refills | Status: AC

## 2018-12-27 NOTE — Assessment & Plan Note (Addendum)
Ultrasound-guided right cephalic vein access obtained with a 20-gauge angiocatheter. Flushed easily without discomfort. Further management per primary treating physician, okay for MRI with IV contrast.

## 2018-12-27 NOTE — Telephone Encounter (Signed)
Patient advised.

## 2018-12-27 NOTE — Progress Notes (Signed)
Subjective:    CC: Poor venous access  HPI: Patricia Webb is a pleasant 48 year old female, she has poor venous access.  She is scheduled for an MR with IV contrast of the abdomen today.  She has been stuck several times down and imaging without any successful venous access.  I am called for further evaluation and definitive treatment.  I reviewed the past medical history, family history, social history, surgical history, and allergies today and no changes were needed.  Please see the problem list section below in epic for further details.  Past Medical History: No past medical history on file. Past Surgical History: Past Surgical History:  Procedure Laterality Date  . KNEE SURGERY Left   . microdiscectomy Bilateral    Social History: Social History   Socioeconomic History  . Marital status: Married    Spouse name: Not on file  . Number of children: Not on file  . Years of education: Not on file  . Highest education level: Not on file  Occupational History  . Occupation: Chartered certified accountant: [employer]   Social Needs  . Financial resource strain: Not on file  . Food insecurity:    Worry: Not on file    Inability: Not on file  . Transportation needs:    Medical: Not on file    Non-medical: Not on file  Tobacco Use  . Smoking status: Never Smoker  . Smokeless tobacco: Never Used  Substance and Sexual Activity  . Alcohol use: Yes    Alcohol/week: 7.0 standard drinks    Types: 7 Glasses of wine per week  . Drug use: Never  . Sexual activity: Yes    Partners: Male    Birth control/protection: Pill  Lifestyle  . Physical activity:    Days per week: Not on file    Minutes per session: Not on file  . Stress: Not on file  Relationships  . Social connections:    Talks on phone: Not on file    Gets together: Not on file    Attends religious service: Not on file    Active member of club or organization: Not on file    Attends meetings of clubs or  organizations: Not on file    Relationship status: Not on file  Other Topics Concern  . Not on file  Social History Narrative  . Not on file   Family History: Family History  Problem Relation Age of Onset  . Healthy Mother   . Healthy Father    Allergies: No Known Allergies Medications: See med rec.  Review of Systems: No fevers, chills, night sweats, weight loss, chest pain, or shortness of breath.   Objective:    General: Well Developed, well nourished, and in no acute distress.  Neuro: Alert and oriented x3, extra-ocular muscles intact, sensation grossly intact.  HEENT: Normocephalic, atraumatic, pupils equal round reactive to light, neck supple, no masses, no lymphadenopathy, thyroid nonpalpable.  Skin: Warm and dry, no rashes. Cardiac: Regular rate and rhythm, no murmurs rubs or gallops, no lower extremity edema.  Respiratory: Clear to auscultation bilaterally. Not using accessory muscles, speaking in full sentences.  20-gauge angiocatheter placed in the right cephalic vein via ultrasound guidance.  This flushed easily and without discomfort.  Impression and Recommendations:    Poor venous access Ultrasound-guided right cephalic vein access obtained with a 20-gauge angiocatheter. Flushed easily without discomfort. Further management per primary treating physician, okay for MRI with IV contrast.  I spent 25  minutes with this patient, greater than 50% was face-to-face time counseling regarding the above diagnoses, specifically the time spent face-to-face, discussing poor venous access and options. ___________________________________________ Ihor Austin. Benjamin Stain, M.D., ABFM., CAQSM. Primary Care and Sports Medicine [facility]  Adjunct Professor of Family Medicine  [employer]

## 2018-12-27 NOTE — Telephone Encounter (Signed)
Patricia Webb called and states she has an MRI with contrast today at 3pm. She reports high anxiety with needles. She would like something to help calm her anxiety. Please advise.

## 2018-12-27 NOTE — Telephone Encounter (Signed)
Sent 1 tablet of valium  She should have a driver for the MRI if she's going to take this medicine

## 2019-01-04 ENCOUNTER — Other Ambulatory Visit: Payer: Self-pay | Admitting: Osteopathic Medicine

## 2019-01-04 NOTE — Telephone Encounter (Signed)
[facility] not refilling medications for Dr Lyn Hollingshead at this time- routed to rx refill pool

## 2019-02-02 ENCOUNTER — Other Ambulatory Visit: Payer: Self-pay | Admitting: Osteopathic Medicine

## 2019-02-02 NOTE — Telephone Encounter (Signed)
Routing refill request to Dr Alexander's rx refill pool 

## 2019-03-01 ENCOUNTER — Other Ambulatory Visit: Payer: Self-pay | Admitting: Osteopathic Medicine

## 2019-03-07 ENCOUNTER — Encounter: Payer: Self-pay | Admitting: Osteopathic Medicine

## 2019-03-07 DIAGNOSIS — Z789 Other specified health status: Secondary | ICD-10-CM

## 2019-03-07 DIAGNOSIS — IMO0001 Reserved for inherently not codable concepts without codable children: Secondary | ICD-10-CM | POA: Insufficient documentation

## 2019-04-08 ENCOUNTER — Other Ambulatory Visit: Payer: Self-pay | Admitting: Osteopathic Medicine

## 2019-04-20 ENCOUNTER — Other Ambulatory Visit: Payer: Self-pay | Admitting: Osteopathic Medicine

## 2019-04-20 NOTE — Telephone Encounter (Signed)
Please advise 

## 2019-05-12 ENCOUNTER — Telehealth: Payer: Self-pay

## 2019-05-12 ENCOUNTER — Ambulatory Visit (INDEPENDENT_AMBULATORY_CARE_PROVIDER_SITE_OTHER): Payer: Commercial Managed Care - PPO | Admitting: Osteopathic Medicine

## 2019-05-12 ENCOUNTER — Encounter: Payer: Self-pay | Admitting: Osteopathic Medicine

## 2019-05-12 VITALS — BP 149/83 | HR 67 | Temp 98.4°F | Wt 158.8 lb

## 2019-05-12 DIAGNOSIS — I10 Essential (primary) hypertension: Secondary | ICD-10-CM

## 2019-05-12 DIAGNOSIS — R635 Abnormal weight gain: Secondary | ICD-10-CM

## 2019-05-12 MED ORDER — PROPRANOLOL HCL ER 60 MG PO CP24: 60.0000 mg | ORAL_CAPSULE | Freq: Every day | ORAL | 3 refills | Status: DC

## 2019-05-12 MED ORDER — NALTREXONE-BUPROPION HCL ER 8-90 MG PO TB12: ORAL_TABLET | ORAL | 0 refills | Status: DC

## 2019-05-12 MED ORDER — NALTREXONE-BUPROPION HCL ER 8-90 MG PO TB12: 2.0000 | ORAL_TABLET | Freq: Two times a day (BID) | ORAL | 1 refills | Status: DC

## 2019-05-12 NOTE — Patient Instructions (Signed)
We are starting a medication today called Contrave, to help with weight loss.  Let me know if there are any issues getting this medication covered, would recommend going online to drug manufacturer's website to see if there are savings coupons or other cost assistance programs.

## 2019-05-12 NOTE — Telephone Encounter (Signed)
BP Readings from Last 3 Encounters:  12/13/18 (!) 175/107  10/08/18 (!) 140/92  09/09/18 (!) 142/85   Agree. At last visit in 11/2018 Follow-up plan: Return in about 1 week (around 12/20/2018) for Recheck blood pressure 1 to 2 days after MRI, go over MRI results.

## 2019-05-12 NOTE — Telephone Encounter (Signed)
Contacted patient to make a follow up appointment, she was going to discuss this at her appointment and the BP pills with PCP and let me know. Transferred caller to [facility] for appointment.

## 2019-05-12 NOTE — Telephone Encounter (Signed)
Sending to provider for review.  Pls contact pt to schedule an appt for BP check. Pt is overdue for a recheck with provider. She left a vm msg requesting med refill. A refill was sent for #15 days. She wants to get a #90 day supply before leaving for vacation. She will need an appt to get full amount of her rx. If pt has a way to check her bp at home, pls set her up with a virtual visit. If not then make an in office visit for her. Thanks.

## 2019-05-12 NOTE — Progress Notes (Signed)
Virtual Visit via Video (App used: Doximity) Note  I connected with      [name] on 05/12/19 at 5:03 PM  by a telemedicine application and verified that I am speaking with the correct person using two identifiers.  Patient is at home  I am in office    I discussed the limitations of evaluation and management by telemedicine and the availability of in person appointments. The patient expressed understanding and agreed to proceed.  History of Present Illness: Patricia Webb is a 48 y.o. female who would like to discuss BP refills, weight gain    BP:  Reportedly in 120's/90's most of the time at home, no chest pain, pressure, shortness of breath.  Weight concerns: Exercising more and eating better but weight has been stagnant.  Has some questions about possibly getting on medications to help suppress appetite, does not want to be on stimulants.  Wt Readings from Last 3 Encounters:  05/12/19 158 lb 12.8 oz (72 kg)  12/13/18 161 lb 4.8 oz (73.2 kg)  10/08/18 150 lb (68 kg)    .  Observations/Objective: BP (!) 149/83 (Patient Position: Sitting, Cuff Size: Normal)   Pulse 67   Temp 98.4 F (36.9 C) (Oral)   Wt 158 lb 12.8 oz (72 kg)   BMI 29.04 kg/m  BP Readings from Last 3 Encounters:  05/12/19 (!) 149/83  12/13/18 (!) 175/107  10/08/18 (!) 140/92   Exam: Normal Speech.  NAD  Lab and Radiology Results No results found for this or any previous visit (from the past 72 hour(s)). No results found.     Assessment and Plan: 48 y.o. female with The primary encounter diagnosis was Hypertension, unspecified type. A diagnosis of Abnormal weight gain was also pertinent to this visit.   PDMP not reviewed this encounter. No orders of the defined types were placed in this encounter.  Meds ordered this encounter  Medications  . DISCONTD: propranolol ER (INDERAL LA) 60 MG 24 hr capsule    Sig: Take 1 capsule (60 mg total) by mouth at bedtime.    Dispense:   90 capsule    Refill:  3    PT REQUESTING 90 DAY SUPPLY No refills. Pt is overdue for BP Check w/PCP.  Marland Kitchen propranolol ER (INDERAL LA) 60 MG 24 hr capsule    Sig: Take 1 capsule (60 mg total) by mouth at bedtime.    Dispense:  90 capsule    Refill:  3  . Naltrexone-buPROPion HCl ER 8-90 MG TB12    Sig: 1 tab daily for week 1, then 1 tab BID for week 2, then 2 tab PO qAM and 1 tab PO qPM for week 3, then 2 tabs BID.    Dispense:  80 tablet    Refill:  0  . Naltrexone-buPROPion HCl ER 8-90 MG TB12    Sig: Take 2 tablets by mouth 2 (two) times daily.    Dispense:  30 tablet    Refill:  1   Patient Instructions  We are starting a medication today called Contrave, to help with weight loss.  Let me know if there are any issues getting this medication covered, would recommend going online to drug manufacturer's website to see if there are savings coupons or other cost assistance programs.   Instructions sent via MyChart. If MyChart not available, pt was given option for info via personal e-mail w/ no guarantee of protected health info over unsecured e-mail communication, and MyChart sign-up instructions  were included.   Follow Up Instructions: Return in about 3 months (around 08/12/2019) for Weight and blood pressure check, can do this at annual/Pap.    I discussed the assessment and treatment plan with the patient. The patient was provided an opportunity to ask questions and all were answered. The patient agreed with the plan and demonstrated an understanding of the instructions.   The patient was advised to call back or seek an in-person evaluation if any new concerns, if symptoms worsen or if the condition fails to improve as anticipated.  25 minutes of non-face-to-face time was provided during this encounter.                      Historical information moved to improve visibility of documentation.  No past medical history on file. Past Surgical History:  Procedure  Laterality Date  . KNEE SURGERY Left   . microdiscectomy Bilateral    Social History   Tobacco Use  . Smoking status: Never Smoker  . Smokeless tobacco: Never Used  Substance Use Topics  . Alcohol use: Yes    Alcohol/week: 7.0 standard drinks    Types: 7 Glasses of wine per week   family history includes Healthy in her father and mother.  Medications: Current Outpatient Medications  Medication Sig Dispense Refill  . ALPRAZolam (XANAX) 0.5 MG tablet Take 0.5-1 tablets (0.25-0.5 mg total) by mouth 2 (two) times daily as needed for anxiety. 15 tablet 0  . propranolol ER (INDERAL LA) 60 MG 24 hr capsule Take 1 capsule (60 mg total) by mouth at bedtime. 90 capsule 3  . Naltrexone-buPROPion HCl ER 8-90 MG TB12 1 tab daily for week 1, then 1 tab BID for week 2, then 2 tab PO qAM and 1 tab PO qPM for week 3, then 2 tabs BID. 80 tablet 0  . [START ON 06/11/2019] Naltrexone-buPROPion HCl ER 8-90 MG TB12 Take 2 tablets by mouth 2 (two) times daily. 30 tablet 1   No current facility-administered medications for this visit.    No Known Allergies  PDMP not reviewed this encounter. No orders of the defined types were placed in this encounter.  Meds ordered this encounter  Medications  . DISCONTD: propranolol ER (INDERAL LA) 60 MG 24 hr capsule    Sig: Take 1 capsule (60 mg total) by mouth at bedtime.    Dispense:  90 capsule    Refill:  3    PT REQUESTING 90 DAY SUPPLY No refills. Pt is overdue for BP Check w/PCP.  Marland Kitchen. propranolol ER (INDERAL LA) 60 MG 24 hr capsule    Sig: Take 1 capsule (60 mg total) by mouth at bedtime.    Dispense:  90 capsule    Refill:  3  . Naltrexone-buPROPion HCl ER 8-90 MG TB12    Sig: 1 tab daily for week 1, then 1 tab BID for week 2, then 2 tab PO qAM and 1 tab PO qPM for week 3, then 2 tabs BID.    Dispense:  80 tablet    Refill:  0  . Naltrexone-buPROPion HCl ER 8-90 MG TB12    Sig: Take 2 tablets by mouth 2 (two) times daily.    Dispense:  30 tablet     Refill:  1

## 2019-06-04 ENCOUNTER — Other Ambulatory Visit: Payer: Self-pay | Admitting: Osteopathic Medicine

## 2019-06-07 ENCOUNTER — Other Ambulatory Visit: Payer: Self-pay | Admitting: Osteopathic Medicine

## 2019-06-07 MED ORDER — CONTRAVE 8-90 MG PO TB12: 2.0000 | ORAL_TABLET | Freq: Two times a day (BID) | ORAL | 2 refills | Status: DC

## 2019-06-21 ENCOUNTER — Telehealth: Payer: Self-pay

## 2019-06-21 NOTE — Telephone Encounter (Signed)
Pt called stating that she and her husband will be traveling for vacation to [place] in October. As per pt, they were informed they will need to have a RT-PCR test done 5 days before they are due to travel abroad, according to country's guidelines reccomendatons. Pt stated that her husband is an Print production planner and is exposed to co-workers who live out of the state. Unsure how that may affect him. Pt was given the info for rapid testing in [place]. Requesting feedback from provider on how else to proceed? Pls advise, thanks.

## 2019-06-21 NOTE — Telephone Encounter (Signed)
May be triage can help with this question.  I am not sure how rapid the [facility] testing sites are, or whether this is improved at all.  If they want to do the self swab here and neither of them have symptoms we will have been in contact with COVID positive patients, that can be arranged.  Are we doing nurse visits for this?

## 2019-06-22 NOTE — Telephone Encounter (Signed)
We can schedule them on your schedule to do the swab since they have not been exposed to someone who have a known positive COVID exposure. It usually takes 4-5 days to get results done or they can go to one of the testing sites. Patricia Webb,CMA

## 2019-06-24 NOTE — Telephone Encounter (Signed)
Pt has been updated of provider's note and recommendation. As per pt, she is going to wait and discuss with her husband what may be the better option. Pt stated testing will need to be done on the weekends. [facility]. I have also informed pt to check CDC web site for updated info regarding COVID. No other inquiries during call.

## 2019-06-24 NOTE — Telephone Encounter (Signed)
Please call patient: [facility], or can come here. We cannot guarantee turnaround time for tests. Please see what they'd like to do, if going to [place] site I will put in order, if coming here can double-book the couple in any 20-minute slot on my schedule. If [facility] they're on their own

## 2019-09-06 ENCOUNTER — Other Ambulatory Visit: Payer: Self-pay | Admitting: Osteopathic Medicine

## 2019-09-06 NOTE — Telephone Encounter (Signed)
[facility] for Contrave. Pt is overdue for BP/WT check and for annual/PAP. Left a detailed vm msg for pt to return a call back to schedule an appt. Aware that rx request will be pending. Forwarding to provider for review.

## 2019-09-06 NOTE — Telephone Encounter (Signed)
Pt returned a call back to clinic. Aware that appt is required for BP/WT check/PAP. Pt was agreeable with plan. As per pt, she has lost 20 pounds since taking Contrave rx. She is pleased with the results and would like to continue taking the rx. Pt was transferred to front desk for appt scheduling. No other inquiries during call.

## 2019-09-14 ENCOUNTER — Ambulatory Visit (INDEPENDENT_AMBULATORY_CARE_PROVIDER_SITE_OTHER): Payer: Commercial Managed Care - PPO | Admitting: Osteopathic Medicine

## 2019-09-14 ENCOUNTER — Other Ambulatory Visit: Payer: Self-pay

## 2019-09-14 ENCOUNTER — Other Ambulatory Visit (HOSPITAL_COMMUNITY)
Admission: RE | Admit: 2019-09-14 | Discharge: 2019-09-14 | Disposition: A | Discharge status: Home / Self Care | Payer: Commercial Managed Care - PPO | Source: Ambulatory Visit | Attending: Osteopathic Medicine | Admitting: Osteopathic Medicine

## 2019-09-14 VITALS — BP 114/77 | HR 69 | Temp 98.3°F | Wt 152.1 lb

## 2019-09-14 DIAGNOSIS — Z124 Encounter for screening for malignant neoplasm of cervix: Secondary | ICD-10-CM | POA: Insufficient documentation

## 2019-09-14 DIAGNOSIS — Z1211 Encounter for screening for malignant neoplasm of colon: Secondary | ICD-10-CM

## 2019-09-14 DIAGNOSIS — Z1231 Encounter for screening mammogram for malignant neoplasm of breast: Secondary | ICD-10-CM | POA: Diagnosis not present

## 2019-09-14 DIAGNOSIS — I10 Essential (primary) hypertension: Secondary | ICD-10-CM

## 2019-09-14 DIAGNOSIS — Z0000 Encounter for general adult medical examination without abnormal findings: Secondary | ICD-10-CM | POA: Insufficient documentation

## 2019-09-14 MED ORDER — PROPRANOLOL HCL ER 60 MG PO CP24: 60.0000 mg | ORAL_CAPSULE | Freq: Every day | ORAL | 3 refills | Status: DC

## 2019-09-14 MED ORDER — ALPRAZOLAM 0.5 MG PO TABS: 0.2500 mg | ORAL_TABLET | Freq: Two times a day (BID) | ORAL | 0 refills | Status: DC | PRN

## 2019-09-14 NOTE — Patient Instructions (Addendum)
General Preventive Care  Most recent routine screening lipids/other labs: definitely due by February 2021  BP goal 130/80 or less    Tobacco: don't!   Alcohol: responsible moderation is ok for most adults - if you have concerns about your alcohol intake, please talk to me!   Exercise: as tolerated to reduce risk of cardiovascular disease and diabetes. Strength training will also prevent osteoporosis.   Mental health: if need for mental health care (medicines, counseling, other), or concerns about moods, please let me know!   Sexual health: if ever a need for STD testing, or if concerns with libido/pain problems, or menopausal symptoms such as mood changes or hot flashes please let me know!   Advanced Directive: Living Will and/or Healthcare Power of Attorney recommended for all adults, regardless of age or health.  Vaccines  Flu vaccine: recommended for almost everyone, every fall.   Shingles vaccine: Shingrix recommended after age 60.   Pneumonia vaccines: Prevnar and Pneumovax recommended after age 40, or sooner if certain medical conditions/smokers   Tetanus booster: Tdap recommended every 10 years. We do not have a documented vaccination date on file for you, but we are going by your report of 2010 which means you'd be due!  Cancer screenings   Colon cancer screening: recommended for everyone at age 80 or definitely by age 66. If you want to get the Cologuard test or a colonoscopy, please check w/ your insurance first to ensure coverage.   Breast cancer screening: mammogram recommended at age 10-75  Cervical cancer screening: Pap every 1 to 5 years depending on age and other risk factors. Can usually stop at age 21 or w/ hysterectomy.   Lung cancer screening: not needed for non-smokers  Infection screenings . HIV, Gonorrhea/Chlamydia: screening as needed . Hepatitis C: recommended once for anyone born 33-1963 . TB: certain at-risk populations, or depending on work  requirements and/or travel history Other . Bone Density Test: recommended for women at age 46

## 2019-09-14 NOTE — Progress Notes (Signed)
HPI: Patricia Webb is a 48 y.o. female who  has no past medical history on file.  she presents to [facility] today, 09/14/19,  for chief complaint of: Annual / Pap  Doing well, no concerns Has lost some weight but would like to maybe come off the contrave Periods are getting irregular, husband has vasectomy     Past medical, surgical, social and family history reviewed:  Patient Active Problem List   Diagnosis Date Noted  . Medical history reviewed previous PCP   . Poor venous access 12/27/2018  . Panic disorder (episodic paroxysmal anxiety) 09/09/2018  . Abdominal spasms 09/09/2018    Past Surgical History:  Procedure Laterality Date  . KNEE SURGERY Left   . microdiscectomy Bilateral     Social History   Tobacco Use  . Smoking status: Never Smoker  . Smokeless tobacco: Never Used  Substance Use Topics  . Alcohol use: Yes    Alcohol/week: 7.0 standard drinks    Types: 7 Glasses of wine per week    Family History  Problem Relation Age of Onset  . Healthy Mother   . Healthy Father      Current medication list and allergy/intolerance information reviewed:    Current Outpatient Medications  Medication Sig Dispense Refill  . ALPRAZolam (XANAX) 0.5 MG tablet Take 0.5-1 tablets (0.25-0.5 mg total) by mouth 2 (two) times daily as needed for anxiety. 15 tablet 0  . CONTRAVE 8-90 MG TB12 TAKE 2 TABLETS BY MOUTH TWICE A DAY 120 tablet 2  . propranolol ER (INDERAL LA) 60 MG 24 hr capsule Take 1 capsule (60 mg total) by mouth at bedtime. 90 capsule 3   No current facility-administered medications for this visit.     No Known Allergies    Review of Systems:  Constitutional:  No  fever, no chills, No recent illness  HEENT: No  headache  Cardiac: No  chest pain, No  pressure,  Respiratory:  No  shortness of breath. No  Cough  Gastrointestinal: No  abdominal pain, No  nausea,  Musculoskeletal: No new  myalgia/arthralgia  Skin: No  Rash, No other wounds/concerning lesions  Genitourinary: No  incontinence, +abnormal genital bleeding, No abnormal genital discharge  Neurologic: No  weakness, No  dizziness  Psychiatric: No  concerns with depression, No  concerns with anxiety, No sleep problems, No mood problems  Exam:  BP 114/77 (BP Location: Left Arm, Patient Position: Sitting, Cuff Size: Normal)   Pulse 69   Temp 98.3 F (36.8 C) (Oral)   Wt 152 lb 1.9 oz (69 kg)   BMI 27.82 kg/m   Constitutional: VS see above. General Appearance: alert, well-developed, well-nourished, NAD  Eyes: Normal lids and conjunctive, non-icteric sclera  Neck: No masses, trachea midline.   Respiratory: Normal respiratory effort.   Gastrointestinal: Nontender, no masses. No hepatomegaly, no splenomegaly. No hernia appreciated. Bowel sounds normal. Rectal exam deferred.   Musculoskeletal: Gait normal. No clubbing/cyanosis of digits.   Neurological: Normal balance/coordination. No tremor.  Skin: warm, dry, intact. No rash/ulcer.  Psychiatric: Normal judgment/insight. Normal mood and affect. Oriented x3.  GYN: No lesions/ulcers to external genitalia, normal urethra, normal vaginal mucosa, physiologic discharge, cervix normal without lesions, uterus not enlarged or tender, adnexa no masses and nontender  BREAST: No rashes/skin changes, normal fibrous breast tissue, no masses or tenderness, normal nipple without discharge, normal axilla   No results found for this or any previous visit (from the past 72 hour(s)).  No results  found.   ASSESSMENT/PLAN: The primary encounter diagnosis was Annual physical exam. Diagnoses of Cervical cancer screening, Encounter for screening mammogram for malignant neoplasm of breast, Colon cancer screening, and Hypertension, unspecified type were also pertinent to this visit.  Orders Placed This Encounter  Procedures  . MM 3D SCREEN BREAST BILATERAL  . CBC  . COMPLETE  METABOLIC PANEL WITH GFR  . LIPID SCREENING    Meds ordered this encounter  Medications  . ALPRAZolam (XANAX) 0.5 MG tablet    Sig: Take 0.5-1 tablets (0.25-0.5 mg total) by mouth 2 (two) times daily as needed for anxiety.    Dispense:  15 tablet    Refill:  0  . propranolol ER (INDERAL LA) 60 MG 24 hr capsule    Sig: Take 1 capsule (60 mg total) by mouth at bedtime.    Dispense:  90 capsule    Refill:  3    Patient Instructions  General Preventive Care  Most recent routine screening lipids/other labs: definitely due by February 2021  BP goal 130/80 or less    Tobacco: don't!   Alcohol: responsible moderation is ok for most adults - if you have concerns about your alcohol intake, please talk to me!   Exercise: as tolerated to reduce risk of cardiovascular disease and diabetes. Strength training will also prevent osteoporosis.   Mental health: if need for mental health care (medicines, counseling, other), or concerns about moods, please let me know!   Sexual health: if ever a need for STD testing, or if concerns with libido/pain problems, or menopausal symptoms such as mood changes or hot flashes please let me know!   Advanced Directive: Living Will and/or Healthcare Power of Attorney recommended for all adults, regardless of age or health.  Vaccines  Flu vaccine: recommended for almost everyone, every fall.   Shingles vaccine: Shingrix recommended after age 450.   Pneumonia vaccines: Prevnar and Pneumovax recommended after age 765, or sooner if certain medical conditions/smokers   Tetanus booster: Tdap recommended every 10 years. We do not have a documented vaccination date on file for you, but we are going by your report of 2010 which means you'd be due!  Cancer screenings   Colon cancer screening: recommended for everyone at age 46 or definitely by age 46. If you want to get the Cologuard test or a colonoscopy, please check w/ your insurance first to ensure coverage.    Breast cancer screening: mammogram recommended at age 46-75  Cervical cancer screening: Pap every 1 to 5 years depending on age and other risk factors. Can usually stop at age 46 or w/ hysterectomy.   Lung cancer screening: not needed for non-smokers  Infection screenings . HIV, Gonorrhea/Chlamydia: screening as needed . Hepatitis C: recommended once for anyone born 591945-1963 . TB: certain at-risk populations, or depending on work requirements and/or travel history Other . Bone Density Test: recommended for women at age 46        Visit summary with medication list and pertinent instructions was printed for patient to review. All questions at time of visit were answered - patient instructed to contact office with any additional concerns or updates. ER/RTC precautions were reviewed with the patient.     Please note: voice recognition software was used to produce this document, and typos may escape review. Please contact Dr. Lyn HollingsheadAlexander for any needed clarifications.     Follow-up plan: Return in about 1 year (around 09/13/2020) [place]call week prior to visit for lab orders), see  me sooner if needed! Marland Kitchen

## 2019-09-15 LAB — CYTOLOGY - PAP
Comment: NEGATIVE
Diagnosis: NEGATIVE
High risk HPV: NEGATIVE

## 2019-11-12 ENCOUNTER — Other Ambulatory Visit: Payer: Self-pay | Admitting: Osteopathic Medicine

## 2019-11-13 NOTE — Telephone Encounter (Signed)
See note from pharmacy below. To pharmacy: WAS PROPANOLOL STOPPED OR DOSAGE CHANGED? THIS MEDICATION WAS INACTIVATED IN OUR RECORDS

## 2019-12-02 ENCOUNTER — Telehealth: Payer: Self-pay

## 2019-12-02 NOTE — Telephone Encounter (Signed)
Echo was ordered 11/2018, nice of them to finally call her! This was done when she had an episode of passing out. See progress ntoe from 12/13/18 if needed.   If she's feeling fine, she doesn't need it

## 2019-12-02 NOTE — Telephone Encounter (Signed)
Pt has been updated of provider's note. As per pt, she no longer has episodes of passing out since taking propranolol rx. Pt is going to disregard referral for echocardiogram.

## 2019-12-02 NOTE — Telephone Encounter (Signed)
Pt called stating she received a call to schedule an appt to complete a echocardiogram. As per pt, she was unaware of the referral. She wants to know why was referral ordered? Pt stated she was confused about the referral because she does not recall speaking with provider about getting testing done. Pls advise, thanks.

## 2019-12-12 ENCOUNTER — Other Ambulatory Visit (HOSPITAL_BASED_OUTPATIENT_CLINIC_OR_DEPARTMENT_OTHER): Payer: Commercial Managed Care - PPO

## 2019-12-30 ENCOUNTER — Encounter: Payer: Self-pay | Admitting: Osteopathic Medicine

## 2019-12-30 ENCOUNTER — Ambulatory Visit (INDEPENDENT_AMBULATORY_CARE_PROVIDER_SITE_OTHER): Payer: Commercial Managed Care - PPO | Admitting: Osteopathic Medicine

## 2019-12-30 ENCOUNTER — Other Ambulatory Visit: Payer: Self-pay

## 2019-12-30 VITALS — BP 145/83 | HR 76 | Temp 98.1°F | Wt 150.0 lb

## 2019-12-30 DIAGNOSIS — K219 Gastro-esophageal reflux disease without esophagitis: Secondary | ICD-10-CM | POA: Insufficient documentation

## 2019-12-30 DIAGNOSIS — K1370 Unspecified lesions of oral mucosa: Secondary | ICD-10-CM | POA: Diagnosis not present

## 2019-12-30 DIAGNOSIS — F419 Anxiety disorder, unspecified: Secondary | ICD-10-CM | POA: Insufficient documentation

## 2019-12-30 DIAGNOSIS — N6019 Diffuse cystic mastopathy of unspecified breast: Secondary | ICD-10-CM | POA: Insufficient documentation

## 2019-12-30 DIAGNOSIS — M545 Low back pain, unspecified: Secondary | ICD-10-CM | POA: Insufficient documentation

## 2019-12-30 MED ORDER — AMOXICILLIN 500 MG PO CAPS: 500.0000 mg | ORAL_CAPSULE | Freq: Three times a day (TID) | ORAL | 0 refills | Status: DC

## 2019-12-30 MED ORDER — AMBULATORY NON FORMULARY MEDICATION: 1 refills | Status: DC

## 2019-12-30 MED ORDER — LIDOCAINE VISCOUS HCL 2 % MT SOLN: 5.0000 mL | OROMUCOSAL | 0 refills | Status: DC | PRN

## 2019-12-30 NOTE — Progress Notes (Signed)
Patricia Webb is a 49 y.o. female who presents to  [place] at [facility]  today, 12/30/19, seeking care for the following: . Thrush concern     ASSESSMENT & PLAN with other pertinent history/findings:  The encounter diagnosis was Unspecified lesions of oral mucosa.   New concern: . HPI: 5 days ago spots on back of throat and spreading towards front of mouth. Concenred about thrush. Orajel not helping. Recent wisdom teeth removal and feels it has been an issue sine then, no abx use  . A/P: on exam no tonsillar exudate or whitish lesions, small red ulcerations on inner gums upper palate anteriorly, few spots posteriorly, seems more ulcerative than candida-related. 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 Consider viral etiology. Will treat conservatively, RTC/follow up w/ dentist if no better, meds as below    Meds ordered this encounter  Medications  . AMBULATORY NON FORMULARY MEDICATION    Sig: Disp [Hydrocortisone 60 mg] + [Nystatin Suspension 30 mL OR Powder 3 million units] + [Diphenhydramine 12.5 mg/5 mL] sig: po swish and spit 5-10 mL q4 h QS 240 mL    Dispense:  240 mL    Refill:  1  . lidocaine (XYLOCAINE) 2 % solution    Sig: Use as directed 5-10 mLs in the mouth or throat every 3 (three) hours as needed for mouth pain.    Dispense:  100 mL    Refill:  0  . amoxicillin (AMOXIL) 500 MG capsule    Sig: Take 1 capsule (500 mg total) by mouth 3 (three) times daily.    Dispense:  21 capsule    Refill:  0       Follow-up instructions: Return if symptoms worsen or fail to improve.                                         BP (!) 145/83 (BP Location: Left Arm, Patient Position: Sitting, Cuff Size: Normal)   Pulse 76   Temp 98.1 F (36.7 C) (Oral)   Wt 150 lb 0.6 oz (68.1 kg)   BMI 27.44 kg/m   No outpatient  medications have been marked as taking for the 12/30/19 encounter (Office Visit) with Emeterio Reeve, DO.    No results found for this or any previous visit (from the past 72 hour(s)).  No results found.  Depression screen [facility] 2/9 09/14/2019 05/12/2019 12/13/2018  Decreased Interest 0 1 0  Down, Depressed, Hopeless 0 1 0  PHQ - 2 Score 0 2 0  Altered sleeping 0 1 3  Tired, decreased energy 0 3 2  Change in appetite 1 1 3   Feeling bad or failure about yourself  0 0 0  Trouble concentrating 0 0 0  Moving slowly or fidgety/restless 0 0 0  Suicidal thoughts 0 0 0  PHQ-9 Score 1 7 8   Difficult doing work/chores Not difficult at all - Not difficult at all    GAD 7 : Generalized Anxiety Score 09/14/2019 05/12/2019 12/13/2018 09/09/2018  Nervous, Anxious, on Edge 1 1 2 1   Control/stop worrying 0 0 0 1  Worry too much - different things 1 0 0 1  Trouble relaxing 1 0 1 1  Restless 0 0 1 1  Easily annoyed or irritable 1 1 0 1  Afraid -  awful might happen 0 0 0 1  Total GAD 7 Score 4 2 4 7   Anxiety Difficulty Not difficult at all Not difficult at all Not difficult at all Somewhat difficult      All questions at time of visit were answered - patient instructed to contact office with any additional concerns or updates.  ER/RTC precautions were reviewed with the patient.  Please note: voice recognition software was used to produce this document, and typos may escape review. Please contact Dr. for any needed clarifications.

## 2020-02-22 ENCOUNTER — Encounter: Payer: Self-pay | Admitting: Osteopathic Medicine

## 2020-06-25 ENCOUNTER — Telehealth: Payer: Self-pay

## 2020-06-25 NOTE — Telephone Encounter (Signed)
Patricia Webb states she had tried Contrave in the past and is now interested in starting Qsymia.  Requesting a callback.

## 2020-06-26 NOTE — Telephone Encounter (Signed)
Last seen March 2021 needs appt if requesting new Rx

## 2020-06-27 NOTE — Telephone Encounter (Signed)
PT scheduled

## 2020-07-04 ENCOUNTER — Other Ambulatory Visit: Payer: Self-pay

## 2020-07-04 ENCOUNTER — Ambulatory Visit (INDEPENDENT_AMBULATORY_CARE_PROVIDER_SITE_OTHER): Payer: No Typology Code available for payment source | Admitting: Osteopathic Medicine

## 2020-07-04 VITALS — BP 122/80 | HR 64 | Wt 168.0 lb

## 2020-07-04 DIAGNOSIS — R635 Abnormal weight gain: Secondary | ICD-10-CM | POA: Diagnosis not present

## 2020-07-04 DIAGNOSIS — I10 Essential (primary) hypertension: Secondary | ICD-10-CM

## 2020-07-04 MED ORDER — PHENTERMINE-TOPIRAMATE ER 3.75-23 MG PO CP24: 1.0000 | ORAL_CAPSULE | Freq: Every morning | ORAL | 0 refills | Status: DC

## 2020-07-04 MED ORDER — PHENTERMINE-TOPIRAMATE ER 7.5-46 MG PO CP24: 1.0000 | ORAL_CAPSULE | Freq: Every morning | ORAL | 0 refills | Status: DC

## 2020-07-04 NOTE — Patient Instructions (Signed)
Weight loss: important things to remember  Things to remember for exercise for weight loss:   Please note - I am not a certified personal trainer. I can present you with ideas and general workout goals, but an exercise program is largely up to you. Find something you can stick with, and something you enjoy!   Slow progression will help prevent injury!  As you progress in your exercise regimen think about gradually increasing the following, week by week:   intensity (how strenuous is your workout?)  frequency (how often are you exercising?)  duration (how many minutes at a time are you exercising?)  Walking for 20 minutes a day is certainly better than nothing, but more strenuous exercise will develop better cardiovascular fitness.   interval training (high-intensity alternating with low-intensity, think walk/jog rather than just walk)  muscle strengthening exercises (weight lifting, calisthenics, yoga) - this also helps prevent osteoporosis!   Things to remember for diet changes for weight loss:   Please note - I am not a certified dietician. I can present you with ideas and general diet goals, but a meal plan is largely up to you. I am happy to refer you to a dietician who can give you a detailed meal plan.  Apps/logs can be helpful to track how you're eating! It's not realistic to be logging everything you eat forever, but when you're starting a healthy eating lifestyle it's very helpful, and checking in with logs now and then helps you stick to your program!   Calorie restriction / portion control with the goal weight loss of no more than one to one and a half pounds per week.   Increase lean protein such as chicken, fish, [place].   Decrease fatty foods such as dairy, butter.   Decrease sugary foods and sweets. Avoid sugary drinks such as soda or juice.  Increase fiber found in fruit and vegetables, whole grains.   Medications approved for long-term use for obesity  Qsymia  (Phentermine + Topiramate) - may also help migraines/headaches  Saxenda (Liraglutide daily injections) - will also help diabetes/prediabetes  Wegovy (Semaglutide weekly injections) - will also help diabetes/prediabetes  Contrave (Bupropion + Naltrexone) - may also help mental health/depression   Orlistat (Xenical Rx, Alli OTC) - not my favorite, can cause diarrhea  Bupropion (Wellbutrin) - generic antidepressant, can also help mental health, help with quitting smoking  I recommend that you research the above medications and see which one(s) your insurance may or may not cover: If you call your insurance, ask them specifically what medications are on their formulary that are approved for obesity treatment. They should be able to send you a list or tell you over the phone. Remember, medications aren't magic! You MUST be diligent about lifestyle changes as well!

## 2020-07-04 NOTE — Progress Notes (Signed)
Patricia Webb is a 49 y.o. female who presents to  [facility] at [facility]  today, 07/04/20, seeking care for the following:  . 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 A diagnosis of Hypertension, unspecified type was also pertinent to this visit.   OK to Rx Valium prior to blood draw, pt advised risk of not getting labs done, declined.    Patient Instructions  Weight loss: important things to remember  Things to remember for exercise for weight loss:   Please note - I am not a certified personal trainer. I can present you with ideas and general workout goals, but an exercise program is largely up to you. Find something you can stick with, and something you enjoy!   Slow progression will help prevent injury!  As you progress in your exercise regimen think about gradually increasing the following, week by week:   intensity (how strenuous is your workout?)  frequency (how often are you exercising?)  duration (how many minutes at a time are you exercising?)  Walking for 20 minutes a day is certainly better than nothing, but more strenuous exercise will develop better cardiovascular fitness.   interval training (high-intensity alternating with low-intensity, think walk/jog rather than just walk)  muscle strengthening exercises (weight lifting, calisthenics, yoga) - this also helps  prevent osteoporosis!   Things to remember for diet changes for weight loss:   Please note - I am not a certified dietician. I can present you with ideas and general diet goals, but a meal plan is largely up to you. I am happy to refer you to a dietician who can give you a detailed meal plan.  Apps/logs can be helpful to track how you're eating! It's not realistic to be logging everything you eat forever, but when you're starting a healthy eating lifestyle it's very helpful, and checking in with logs now and then helps you stick to your program!   Calorie restriction / portion control with the goal weight loss of no more than one to one and a half pounds per week.   Increase lean protein such as chicken, fish, [place].   Decrease fatty foods such as dairy, butter.   Decrease sugary foods and sweets. Avoid sugary drinks such as soda or juice.  Increase fiber found in fruit and vegetables, whole grains.   Medications approved for long-term use for obesity  Qsymia (Phentermine + Topiramate) - may also help migraines/headaches  Saxenda (Liraglutide daily injections) - will also help diabetes/prediabetes  Wegovy (Semaglutide weekly injections) - will also help diabetes/prediabetes  Contrave (Bupropion + Naltrexone) - may also help mental health/depression   Orlistat (Xenical Rx, Alli OTC) - not my favorite, can cause diarrhea  Bupropion (Wellbutrin) - generic antidepressant, can also help mental health, help with quitting smoking  I recommend that you research the  above medications and see which one(s) your insurance may or may not cover: If you call your insurance, ask them specifically what medications are on their formulary that are approved for obesity treatment. They should be able to send you a list or tell you over the phone. Remember, medications aren't magic! You MUST be diligent about lifestyle changes as well!      Orders Placed This Encounter  Procedures  . CBC  .  COMPLETE METABOLIC PANEL WITH GFR  . Lipid panel  . TSH    Meds ordered this encounter  Medications  . Phentermine-Topiramate 3.75-23 MG CP24    Sig: Take 1 tablet by mouth every morning for 14 days.    Dispense:  14 capsule    Refill:  0  . Phentermine-Topiramate 7.5-46 MG CP24    Sig: Take 1 tablet by mouth every morning.    Dispense:  30 capsule    Refill:  0    To fill after finish course of 3.75-23 mg dose       Follow-up instructions: Return in about 3 months (around 10/03/2020) for monitor weight on Rx .                                         BP 122/80 (BP Location: Left Arm, Patient Position: Sitting)   Pulse 64   Wt 168 lb (76.2 kg)   SpO2 99%   BMI 30.73 kg/m   Current Meds  Medication Sig  . propranolol ER (INDERAL LA) 60 MG 24 hr capsule TAKE 1 CAPSULE BY MOUTH AT BEDTIME.    No results found for this or any previous visit (from the past 72 hour(s)).  No results found.     All questions at time of visit were answered - patient instructed to contact office with any additional concerns or updates.  ER/RTC precautions were reviewed with the patient as applicable.   Please note: voice recognition software was used to produce this document, and typos may escape review. Please contact Dr. Lyn Hollingshead for any needed clarifications.

## 2020-07-10 ENCOUNTER — Telehealth: Payer: Self-pay | Admitting: Osteopathic Medicine

## 2020-07-10 NOTE — Telephone Encounter (Signed)
Patient was prescribed Qysmia (she knows she requested it by name), medication was extremely expensive. She had spoken to the pharmaceutical company and was trying to get it cheaper. She ended up realizing that it would be cheaper if the phentermine and topiramate were sent in individually. This would help with the cost greatly.

## 2020-07-12 MED ORDER — PHENTERMINE HCL 15 MG PO CAPS: 15.0000 mg | ORAL_CAPSULE | ORAL | 0 refills | Status: DC

## 2020-07-12 MED ORDER — TOPIRAMATE 50 MG PO TABS: 50.0000 mg | ORAL_TABLET | Freq: Two times a day (BID) | ORAL | 0 refills | Status: DC

## 2020-07-12 NOTE — Telephone Encounter (Signed)
Separate Rx do not tend to be as effective but I sent these  Will need to schedule followup in 3 mos for weight check

## 2020-07-13 NOTE — Telephone Encounter (Signed)
Patient advised of information below. AM 

## 2020-07-27 ENCOUNTER — Telehealth: Payer: Self-pay

## 2020-07-27 NOTE — Telephone Encounter (Signed)
Patient is getting an RFA procedure done on her face 08/11/2020. She is asking for paper work or letter to be emailed to to the clinic she is attending. Patients last appointment was on 07/04/2020, she is aware that you are out of the office this week. The clinic wants verify that the patient is not hypertensive. Please advise

## 2020-07-31 NOTE — Telephone Encounter (Signed)
Per patient, she needs this emailed to [email or web address].   Patient reports that they are concerned that she is on medication for hypertension and scheduled to have a radio frequency skin tightening procedure done.  They need documentation that she is controlled and there are no concerns about her having this procedure.

## 2020-07-31 NOTE — Telephone Encounter (Signed)
Need a fax number for the clinic, will write letter once I have this information, patient's insurance may bill for time spent on this request    BP Readings from Last 3 Encounters:  07/04/20 122/80  12/30/19 (!) 145/83  09/14/19 114/77

## 2020-08-03 ENCOUNTER — Encounter: Payer: Self-pay | Admitting: Osteopathic Medicine

## 2020-08-03 NOTE — Telephone Encounter (Signed)
Advised pt that letter is available via MyChart. Sent activation code. Patient expressed understanding.

## 2020-08-03 NOTE — Telephone Encounter (Signed)
I am not going to send letters through email to an office that is not affiliated with [facility] due to HIPAA concerns.  I have generated a letter and patient can access it on my chart and forward it herself if she wishes.  Please give her instructions on signing up for my chart.

## 2020-08-05 ENCOUNTER — Other Ambulatory Visit: Payer: Self-pay | Admitting: Osteopathic Medicine

## 2020-09-30 ENCOUNTER — Other Ambulatory Visit: Payer: Self-pay | Admitting: Osteopathic Medicine

## 2020-10-10 ENCOUNTER — Encounter: Payer: Self-pay | Admitting: Osteopathic Medicine

## 2020-10-18 ENCOUNTER — Encounter: Payer: Self-pay | Admitting: Osteopathic Medicine

## 2020-10-18 ENCOUNTER — Ambulatory Visit (INDEPENDENT_AMBULATORY_CARE_PROVIDER_SITE_OTHER): Payer: No Typology Code available for payment source | Admitting: Osteopathic Medicine

## 2020-10-18 VITALS — BP 126/79 | HR 72 | Temp 97.6°F | Wt 153.9 lb

## 2020-10-18 DIAGNOSIS — R635 Abnormal weight gain: Secondary | ICD-10-CM | POA: Diagnosis not present

## 2020-10-18 DIAGNOSIS — Z1231 Encounter for screening mammogram for malignant neoplasm of breast: Secondary | ICD-10-CM

## 2020-10-18 MED ORDER — TOPIRAMATE 50 MG PO TABS
50.0000 mg | ORAL_TABLET | Freq: Two times a day (BID) | ORAL | 1 refills | Status: DC
Start: 2020-10-18 — End: 2021-04-18

## 2020-10-18 MED ORDER — PHENTERMINE HCL 15 MG PO CAPS
15.0000 mg | ORAL_CAPSULE | ORAL | 0 refills | Status: DC
Start: 2020-10-18 — End: 2021-01-16

## 2020-10-18 NOTE — Patient Instructions (Signed)
Imaging / schedule mammogram [phone]

## 2020-10-18 NOTE — Progress Notes (Signed)
Patricia Webb is a 49 y.o. female who presents to  [facility] at [facility]  today, 10/18/20, seeking care for the following:  Marland Kitchen Medically managed weight loss   Weight gain / difficulty with weight loss - woul dlike to start Qsymia  ? Medications  Contrave initiated 04/2019 and d/c later that year due to issues w/ side effects   Qsymia initiated 07/04/2020, needed to switch to separate Rx phentermine 15 mg daily and topiramate 50 mg bid.  ? Dietary  Travels a lot, diet could be better ? Activity/Exercise   Walking 3-4 miles, 4-5 days per week  ? 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 --> ok to continue current Rx   07/04/20 168 lb (76.2 kg) --> phentermine 15 mg daily + topiramate 50 mg bid   12/30/19 150 lb 0.6 oz (68.1 kg)  09/14/19 152 lb 1.9 oz (69 kg)      ASSESSMENT & PLAN with other pertinent findings:  The primary encounter diagnosis was Abnormal weight gain. A diagnosis of Breast cancer screening by mammogram was also pertinent to this visit.   No results found for this or any previous visit (from the past 24 hour(s)).   Patient Instructions  Imaging / schedule mammogram [phone]   Orders Placed This Encounter  Procedures  . MM 3D SCREEN BREAST BILATERAL    Meds ordered this encounter  Medications  . phentermine 15 MG capsule    Sig: Take 1 capsule (15 mg total) by mouth every morning.    Dispense:  90 capsule    Refill:  0  . topiramate (TOPAMAX) 50 MG tablet    Sig: Take 1 tablet (50 mg total) by mouth 2 (two) times daily.    Dispense:  180 tablet    Refill:  1       Follow-up instructions: Return in about 6 months (around 04/18/2021) for ANNUAL CHECK-UP - SEE [place] SOONER IF NEEDED.                                         BP 126/79   Pulse 72   Temp 97.6 F (36.4 C)    Wt 153 lb 14.4 oz (69.8 kg)   SpO2 100%   BMI 28.15 kg/m   Current Meds  Medication Sig  . propranolol ER (INDERAL LA) 60 MG 24 hr capsule TAKE 1 CAPSULE BY MOUTH EVERYDAY AT BEDTIME  . [DISCONTINUED] phentermine 15 MG capsule Take 1 capsule (15 mg total) by mouth every morning.  . [DISCONTINUED] topiramate (TOPAMAX) 50 MG tablet TAKE 1 TABLET BY MOUTH TWICE A DAY    No results found for this or any previous visit (from the past 72 hour(s)).  No results found.     All questions at time of visit were answered - patient instructed to contact office with any additional concerns or updates.  ER/RTC precautions were reviewed with the patient as applicable.   Please note: voice recognition software was used to produce this document, and typos may escape review. Please contact Dr. Lyn Hollingshead for any needed clarifications.

## 2020-10-30 ENCOUNTER — Other Ambulatory Visit: Payer: Self-pay | Admitting: Osteopathic Medicine

## 2020-10-30 ENCOUNTER — Other Ambulatory Visit: Payer: Self-pay

## 2020-10-30 DIAGNOSIS — Z1231 Encounter for screening mammogram for malignant neoplasm of breast: Secondary | ICD-10-CM

## 2020-10-30 DIAGNOSIS — R928 Other abnormal and inconclusive findings on diagnostic imaging of breast: Secondary | ICD-10-CM

## 2021-01-02 ENCOUNTER — Inpatient Hospital Stay: Admission: RE | Admit: 2021-01-02 | Payer: No Typology Code available for payment source | Source: Ambulatory Visit

## 2021-01-04 LAB — HM MAMMOGRAPHY

## 2021-01-08 ENCOUNTER — Encounter: Payer: Self-pay | Admitting: Osteopathic Medicine

## 2021-01-11 ENCOUNTER — Other Ambulatory Visit: Payer: Self-pay | Admitting: Osteopathic Medicine

## 2021-01-11 ENCOUNTER — Encounter: Payer: Self-pay | Admitting: Osteopathic Medicine

## 2021-01-15 NOTE — Telephone Encounter (Signed)
Pt called and asked for a refill of Phentermine. Pt has follow up appt in June.

## 2021-01-16 NOTE — Telephone Encounter (Signed)
Per pt, she was informed to follow up in June, not monthly for weight check. Pls advise, thanks.

## 2021-02-25 ENCOUNTER — Ambulatory Visit: Payer: No Typology Code available for payment source

## 2021-02-28 IMAGING — MR MR MRA ABDOMEN W/ OR W/O CM
8 series · 43 of 48 positions shown · IV contrast (multihance)
Comparison: Abdominal ultrasound on 08/26/2018

CLINICAL DATA: New diagnosis of hypertension with headache,
dizziness and syncopal episode. Ultrasound demonstrates shorter left
kidney compared to the right.

EXAM:
MRA ABDOMEN WITH CONTRAST
TECHNIQUE: Multiplanar, multiecho pulse sequences of the abdomen were obtained
with intravenous contrast. Angiographic images of abdomen were
obtained using MRA technique with intravenous contrast.
CONTRAST:  10mL MULTIHANCE GADOBENATE DIMEGLUMINE 529 MG/ML IV SOLN

[Series 4: bSSFP · coronal · 4.0mm · 0.78mm/px · 5 of 43 slices shown]
[im 1/43]
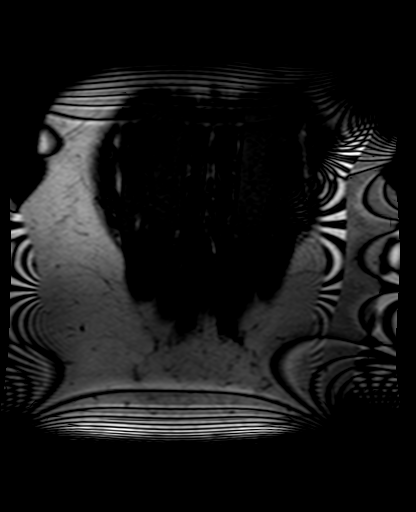
[im 11/43]
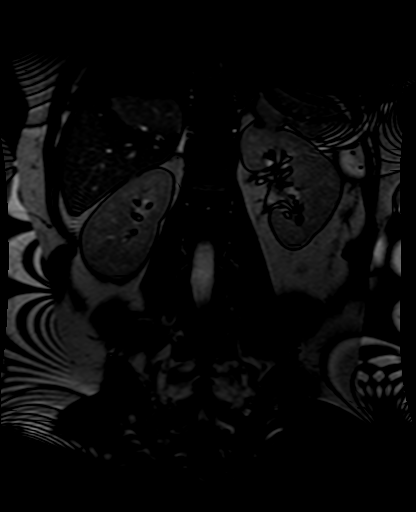
[im 22/43]
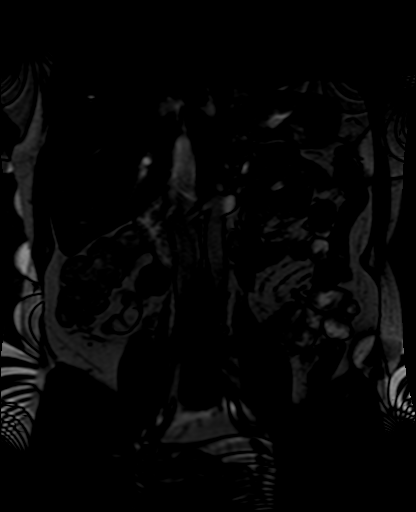
[im 32/43]
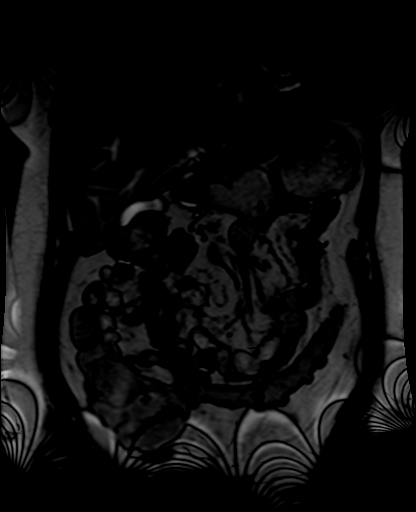
[im 43/43]
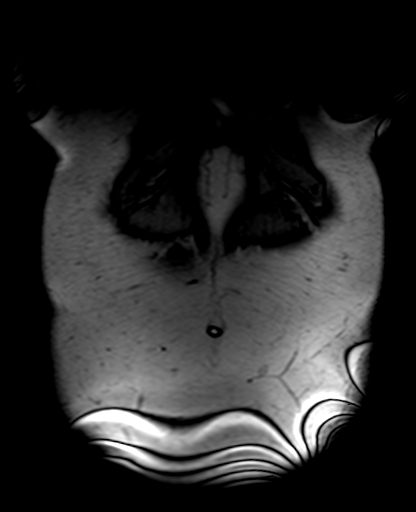

[Series 7: axial t1_composed_1 · axial · 6.0mm · 1.48mm/px · z∈[-16,+193]mm · 3 of 30 slices shown]
[im 1/30]
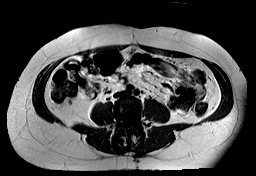
[im 15/30]
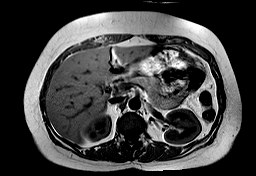
[im 30/30]
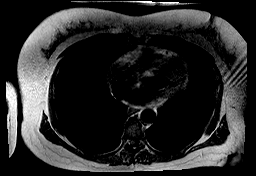

[Series 10: T2 · axial · 6.0mm · 1.48mm/px · z∈[-16,+193]mm · 3 of 30 slices shown]
[im 1/30]
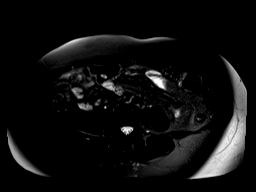
[im 15/30]
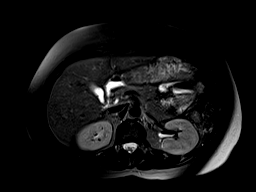
[im 30/30]
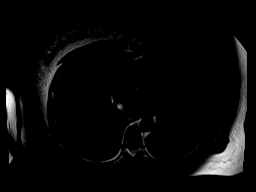

[Series 11: T1 dynamic fat-sat · axial · non-contrast · 4.0mm · 1.19mm/px · z∈[-54,+198]mm · 6 of 64 slices shown]
[im 1/64]
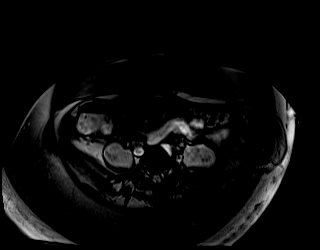
[im 13/64]
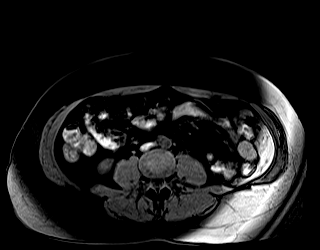
[im 26/64]
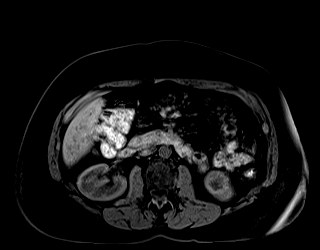
[im 38/64]
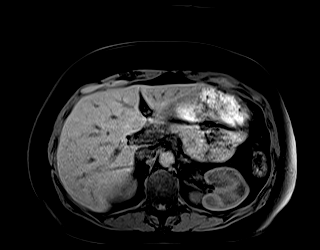
[im 51/64]
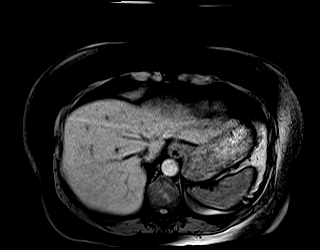
[im 64/64]
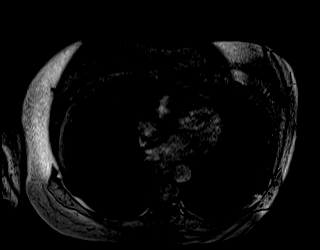

[Series 15: (id)_pre · coronal · 1.5mm · 0.91mm/px · 8 of 80 slices shown]
[im 1/80]
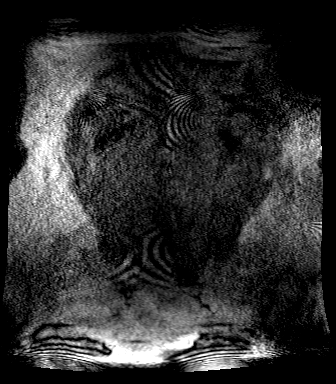
[im 12/80]
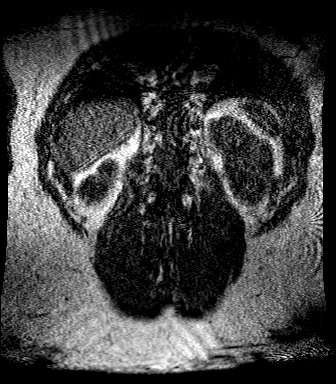
[im 23/80]
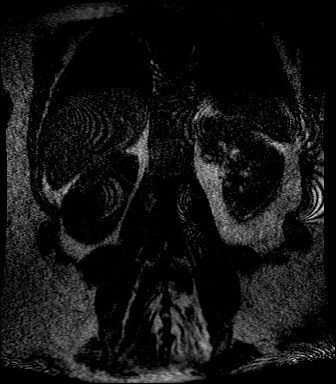
[im 34/80]
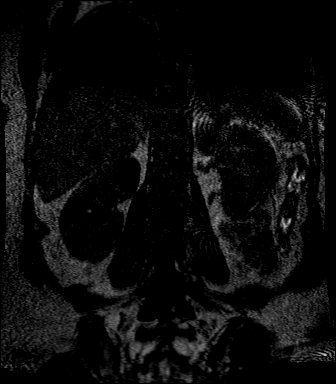
[im 46/80]
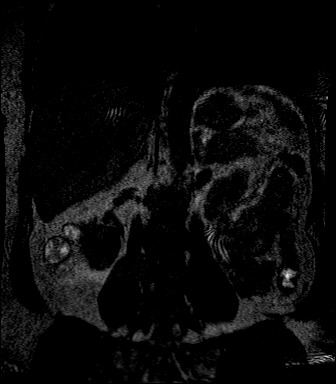
[im 57/80]
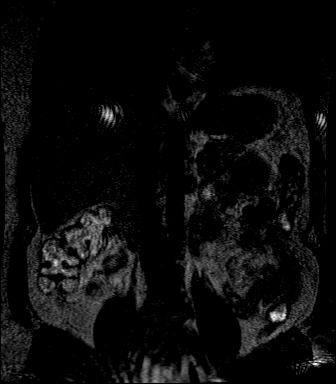
[im 68/80]
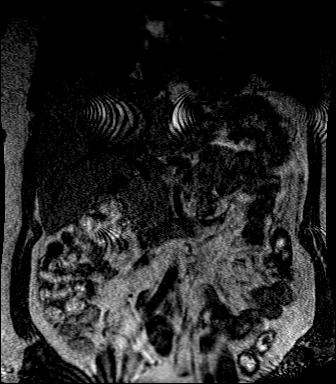
[im 80/80]
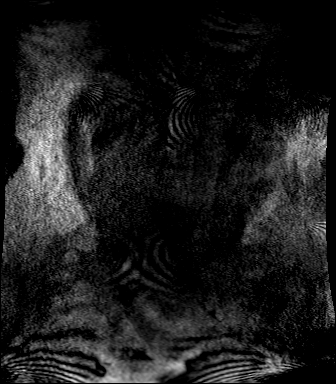

[Series 17: (id)_post · coronal · 1.5mm · 0.91mm/px · 8 of 80 slices shown]
[im 1/80]
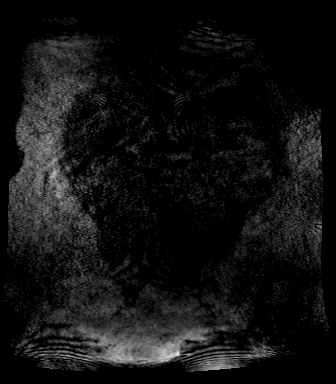
[im 12/80]
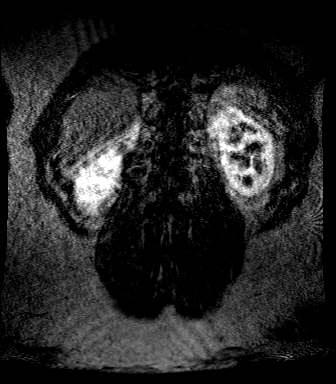
[im 23/80]
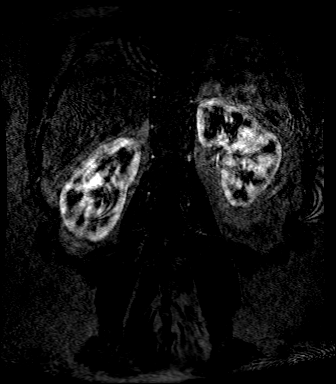
[im 34/80]
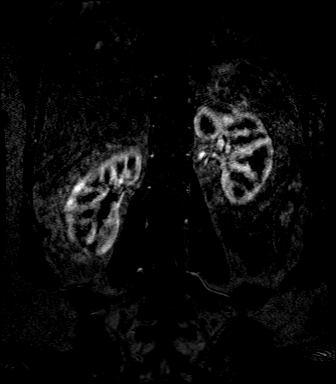
[im 46/80]
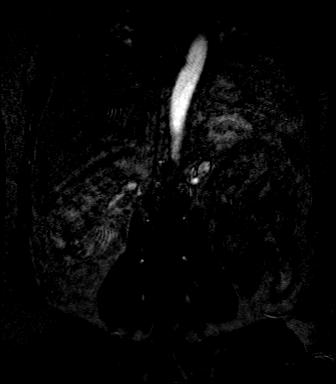
[im 57/80]
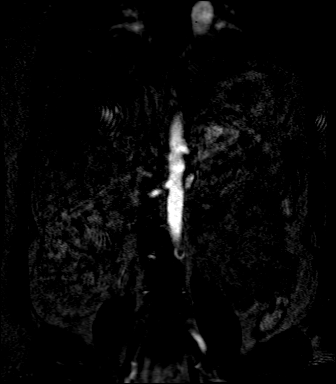
[im 68/80]
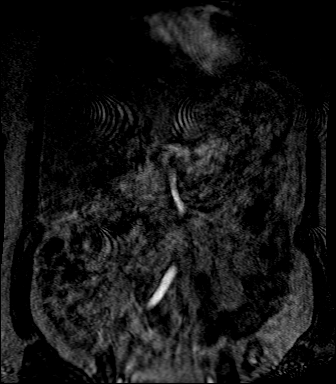
[im 80/80]
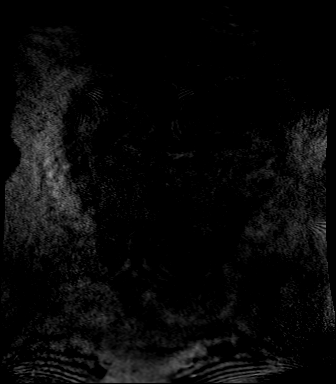

[Series 18: t1_vibe_fs_tra_post · axial · 4.0mm · 1.19mm/px · z∈[-54,+198]mm · 6 of 64 slices shown]
[im 1/64]
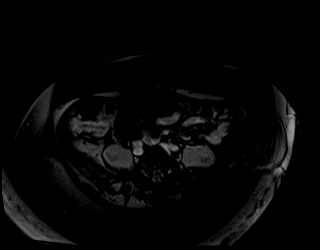
[im 13/64]
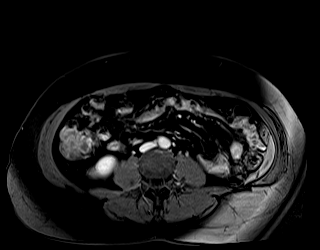
[im 26/64]
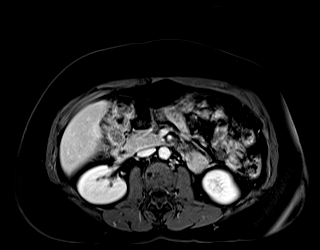
[im 38/64]
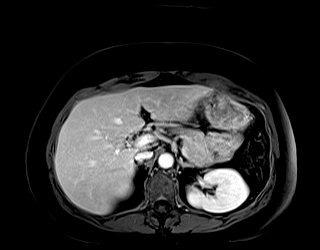
[im 51/64]
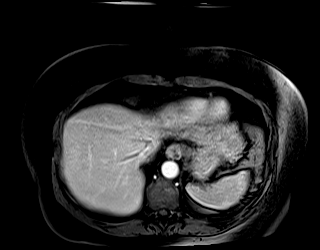
[im 64/64]
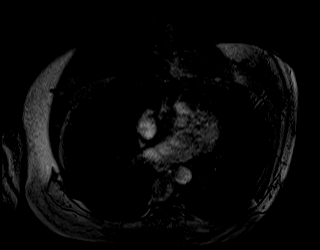

[Series 19: t1_vibe_fs_cor_p2_bh_post · coronal · 2.0mm · 1.39mm/px · 4 of 88 slices shown]
[im 1/88]
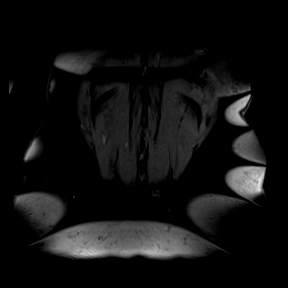
[im 11/88]
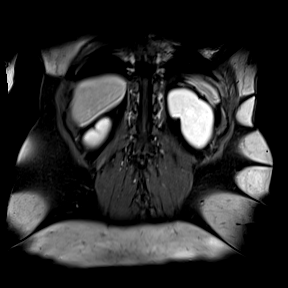
[im 22/88]
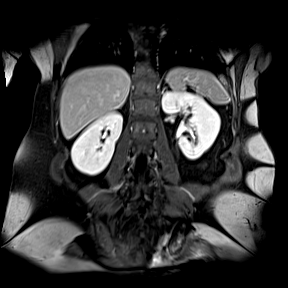
[im 33/88]
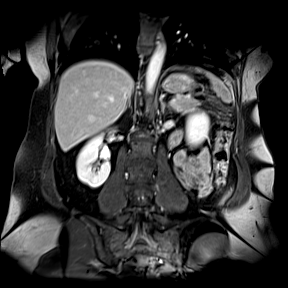

[43 of 48 positions shown; findings below may reference images not displayed]

FINDINGS: VASCULAR:

The abdominal aorta is normally patent. No evidence of aneurysmal
disease, aortic stenosis, atherosclerosis or vasculitis. Visceral
arteries are normally patent including the celiac axis, superior
mesenteric artery, bilateral single renal arteries and inferior
mesenteric artery. There is no evidence of renal artery stenosis or
evidence to suggest fibromuscular dysplasia by MRA. Visualized iliac
arteries are normally patent.

NON-VASCULAR:

The kidneys showed no significant discrepancy in volume by MRI and
the previous length measurements were likely not accurate by
ultrasound due to foreshortening of the left kidney on the image
that the measurement was obtained on. Based on three-dimensional
measurements, the right kidney measures approximately 10.3 x 5.5 x
5.1 cm for estimated volume of 150 mL and the left kidney measures
approximately 10.1 x 6.4 x 5.3 cm for estimated volume of 178 mL. No
evidence of hydronephrosis, cortical atrophy or renal masses. The
adrenal glands appear normal without evidence of adrenal mass. No
lymphadenopathy identified in the abdomen. No abnormal fluid
collections or visible abnormality involving bowel. There is a small
cyst in the superior liver measuring approximately 6 mm.
IMPRESSION: 1. No evidence of renal artery stenosis or other arterial vascular
findings in the abdomen.
2. No significant discrepancy in renal size as questioned by prior
ultrasound. The left kidney actually has higher volume than the
right and both kidneys demonstrate no evidence of atrophy,
obstruction or lesions.

## 2021-04-11 ENCOUNTER — Other Ambulatory Visit: Payer: Self-pay | Admitting: Osteopathic Medicine

## 2021-04-18 ENCOUNTER — Other Ambulatory Visit: Payer: Self-pay

## 2021-04-18 ENCOUNTER — Ambulatory Visit (INDEPENDENT_AMBULATORY_CARE_PROVIDER_SITE_OTHER): Payer: No Typology Code available for payment source | Admitting: Osteopathic Medicine

## 2021-04-18 VITALS — BP 118/76 | HR 78 | Temp 98.3°F | Wt 140.1 lb

## 2021-04-18 DIAGNOSIS — R635 Abnormal weight gain: Secondary | ICD-10-CM | POA: Diagnosis not present

## 2021-04-18 DIAGNOSIS — Z0000 Encounter for general adult medical examination without abnormal findings: Secondary | ICD-10-CM

## 2021-04-18 DIAGNOSIS — Z1211 Encounter for screening for malignant neoplasm of colon: Secondary | ICD-10-CM

## 2021-04-18 DIAGNOSIS — K219 Gastro-esophageal reflux disease without esophagitis: Secondary | ICD-10-CM

## 2021-04-18 DIAGNOSIS — I10 Essential (primary) hypertension: Secondary | ICD-10-CM | POA: Diagnosis not present

## 2021-04-18 MED ORDER — PROPRANOLOL HCL ER 60 MG PO CP24: ORAL_CAPSULE | ORAL | 3 refills | Status: DC

## 2021-04-18 MED ORDER — PHENTERMINE HCL 15 MG PO CAPS: 15.0000 mg | ORAL_CAPSULE | Freq: Every day | ORAL | 3 refills | Status: DC

## 2021-04-18 MED ORDER — TOPIRAMATE 50 MG PO TABS: 50.0000 mg | ORAL_TABLET | Freq: Two times a day (BID) | ORAL | 3 refills | Status: DC

## 2021-04-18 NOTE — Progress Notes (Signed)
Patricia Webb is a 50 y.o. female who presents to  [facility] at [facility]  today, 04/18/21, seeking care for the following:  Annual physical      ASSESSMENT & PLAN with other pertinent findings:  The primary encounter diagnosis was Annual physical exam. Diagnoses of Abnormal weight gain, Gastroesophageal reflux disease, unspecified whether esophagitis present, Hypertension, unspecified type, and Colon cancer screening were also pertinent to this visit.    There are no Patient Instructions on file for this visit.  Orders Placed This Encounter  Procedures   Cologuard    Meds ordered this encounter  Medications   phentermine 15 MG capsule    Sig: Take 1 capsule (15 mg total) by mouth daily.    Dispense:  90 capsule    Refill:  3    Not to exceed 3 additional fills before 07/15/2021.   propranolol ER (INDERAL LA) 60 MG 24 hr capsule    Sig: TAKE 1 CAPSULE BY MOUTH EVERYDAY AT BEDTIME    Dispense:  90 capsule    Refill:  3   topiramate (TOPAMAX) 50 MG tablet    Sig: Take 1 tablet (50 mg total) by mouth 2 (two) times daily.    Dispense:  180 tablet    Refill:  3   General Preventive Care Most recent routine screening labs: ordered today.  Blood pressure goal 130/80 or less.  Tobacco: don't! Alcohol: responsible moderation is ok for most adults - if you have concerns about your alcohol intake, please talk to me!  Exercise: as tolerated to reduce risk of cardiovascular disease and diabetes. Strength training will also prevent osteoporosis.  Mental health: if need for mental health care (medicines, counseling, other), or concerns about moods, please let me know!  Sexual / Reproductive health: if need for STD testing, or if concerns with libido/pain problems, please let me know!  Advanced Directive: Living Will and/or Healthcare Power of Attorney recommended for all adults, regardless of age or health.  Vaccines Flu vaccine: for  almost everyone, every fall.  Shingles vaccine: after age 110.  Pneumonia vaccines: after age 59. Tetanus booster: every 10 years COVID vaccine: STRONGLY RECOMMENDED  Cancer screenings  Colon cancer screening: for everyone age 29-75. Colonoscopy available for all, many people also qualify for the Cologuard stool test  Breast cancer screening: mammogram 12/2021, no later than 12/2022 Cervical cancer screening: Pap due 08/2022 Lung cancer screening: not needed for non-smokers  Infection screenings  HIV: recommended screening at least once age 32-65 Gonorrhea/Chlamydia, other STI: screening as needed Hepatitis C: recommended once for everyone age 33-75 TB: certain at-risk populations, or depending on work requirements and/or travel history Other Bone Density Test: recommended for women at age 55         See below for relevant physical exam findings  See below for recent lab and imaging results reviewed  Medications, allergies, PMH, PSH, SocH, FamH reviewed below    Follow-up instructions: Return in about 1 year (around 04/18/2022) for [place] (call week prior to visit for lab orders - needle phobia, ok to Rx Valium for blood draw).                                        Exam:  BP 118/76 (BP Location: Left Arm, Patient Position: Sitting, Cuff Size: Normal)   Pulse 78   Temp 98.3 F (36.8  C) (Oral)   Wt 140 lb 1.9 oz (63.6 kg)   BMI 25.63 kg/m  Constitutional: VS see above. General Appearance: alert, well-developed, well-nourished, NAD Neck: No masses, trachea midline.  Respiratory: Normal respiratory effort. no wheeze, no rhonchi, no rales Cardiovascular: S1/S2 normal, no murmur, no rub/gallop auscultated. RRR.  Musculoskeletal: Gait normal. Symmetric and independent movement of all extremities Abdominal: non-tender, non-distended, no appreciable organomegaly, neg Murphy's, BS WNLx4 Neurological: Normal balance/coordination. No  tremor. Skin: warm, dry, intact.  Psychiatric: Normal judgment/insight. Normal mood and affect. Oriented x3.   Current Meds  Medication Sig   [DISCONTINUED] phentermine 15 MG capsule TAKE 1 CAPSULE BY MOUTH EVERY DAY IN THE MORNING   [DISCONTINUED] propranolol ER (INDERAL LA) 60 MG 24 hr capsule TAKE 1 CAPSULE BY MOUTH EVERYDAY AT BEDTIME   [DISCONTINUED] topiramate (TOPAMAX) 50 MG tablet Take 1 tablet (50 mg total) by mouth 2 (two) times daily.    No Known Allergies  Patient Active Problem List   Diagnosis Date Noted   Anxiety 12/30/2019   Fibrocystic breast 12/30/2019   GERD (gastroesophageal reflux disease) 12/30/2019   Low back pain 12/30/2019   Medical history reviewed previous PCP    Poor venous access 12/27/2018   Panic disorder (episodic paroxysmal anxiety) 09/09/2018   Abdominal spasms 09/09/2018   Ovarian cyst, right 06/02/2017   History of tear of ACL (anterior cruciate ligament) 08/07/2015    Family History  Problem Relation Age of Onset   Healthy Mother    Healthy Father     Social History   Tobacco Use  Smoking Status Never  Smokeless Tobacco Never    Past Surgical History:  Procedure Laterality Date   KNEE SURGERY Left    microdiscectomy Bilateral     Immunization History  Administered Date(s) Administered   Tdap 10/27/2008    No results found for this or any previous visit (from the past 2160 hour(s)).  No results found.     All questions at time of visit were answered - patient instructed to contact office with any additional concerns or updates. ER/RTC precautions were reviewed with the patient as applicable.   Please note: manual typing as well as voice recognition software may have been used to produce this document - typos may escape review. Please contact Dr. Lyn Hollingshead for any needed clarifications.

## 2021-04-22 ENCOUNTER — Encounter: Payer: No Typology Code available for payment source | Admitting: Osteopathic Medicine

## 2021-05-07 ENCOUNTER — Other Ambulatory Visit: Payer: Self-pay | Admitting: Osteopathic Medicine

## 2021-05-13 ENCOUNTER — Other Ambulatory Visit: Payer: Self-pay | Admitting: Osteopathic Medicine

## 2021-05-27 DIAGNOSIS — Z87820 Personal history of traumatic brain injury: Secondary | ICD-10-CM

## 2021-05-27 HISTORY — DX: Personal history of traumatic brain injury: Z87.820

## 2021-05-30 ENCOUNTER — Encounter: Payer: Self-pay | Admitting: Osteopathic Medicine

## 2021-06-11 ENCOUNTER — Telehealth: Payer: Self-pay

## 2021-06-11 NOTE — Telephone Encounter (Signed)
Made a virtual appt with pt 06/13/2021 @ 7:20-tvt

## 2021-06-11 NOTE — Telephone Encounter (Signed)
Pt left a vm msg stating she had a fall while vacationing in [place]. Per pt, she was diagnosed having a concussion and spent two days in the hospital. She was informed to get a Neurology referral from her provider. Pls contact patient to schedule a virtual to discuss in further details. Thanks in advance.

## 2021-06-13 ENCOUNTER — Other Ambulatory Visit: Payer: Self-pay

## 2021-06-13 ENCOUNTER — Telehealth (INDEPENDENT_AMBULATORY_CARE_PROVIDER_SITE_OTHER): Payer: No Typology Code available for payment source | Admitting: Osteopathic Medicine

## 2021-06-13 ENCOUNTER — Encounter: Payer: Self-pay | Admitting: Osteopathic Medicine

## 2021-06-13 VITALS — Wt 139.6 lb

## 2021-06-13 DIAGNOSIS — S060X9A Concussion with loss of consciousness of unspecified duration, initial encounter: Secondary | ICD-10-CM | POA: Diagnosis not present

## 2021-06-13 NOTE — Progress Notes (Signed)
Telemedicine Visit via  Video & Audio (App used: MyChart)  I connected with Patricia Webb on 06/13/21 at 8:00 AM  by phone or  telemedicine application as noted above  I verified that I am speaking with or regarding  the correct patient using two identifiers.  Participants: Myself, Dr Sunnie Nielsen DO Patient: Patricia Webb Patient proxy if applicable: none Other, if applicable: none  Patient is at home I am in office at [facility]    I discussed the limitations of evaluation and management  by telemedicine and the availability of in person appointments.  The participant(s) above expressed understanding and  agreed to proceed with this appointment via telemedicine.       History of Present Illness: Patricia Webb is a 50 y.o. female who would like to discuss referral   Larey Seat a week ago on vacation, spent 2 days in hospital for concussion. Ruled out serious head/neck injury. Needs f/u w/ neurologist here.  She states that there was some concern for possible cervical spinal issue.  She did lose consciousness and has some short-term amnesia surrounding the actual fall/event.  She states she was told to wear a cervical neck collar until she was able to be cleared by neurology, but she has not been using this because she feels okay.  She denies headache, vision change.  She has been working as normally.     Observations/Objective: Wt 139 lb 9.6 oz (63.3 kg)   BMI 25.53 kg/m  BP Readings from Last 3 Encounters:  04/18/21 118/76  10/18/20 126/79  07/04/20 122/80   Exam: Normal Speech.  NAD  Lab and Radiology Results No results found for this or any previous visit (from the past 72 hour(s)). No results found.     Assessment and Plan: 50 y.o. female with The encounter diagnosis was Concussion with loss of consciousness, initial encounter.  Loss of consciousness/amnesia is definitely concerning but thankfully the patient's symptoms do not seem  to causing her any limitations.  I think reasonable to get second opinion from neurology with regard to reviewing those medical records, but definitely reassured the patient is not exhibiting any significant postconcussion syndrome.  At this was her first head trauma/concussion event.  Advised on relative brain rest which she does not seem to be limited in her ADLs in any way at this time.  PDMP not reviewed this encounter. Orders Placed This Encounter  Procedures   Ambulatory referral to Neurology    Referral Priority:   Routine    Referral Type:   Consultation    Referral Reason:   Specialty Services Required    Requested Specialty:   Neurology    Number of Visits Requested:   1   No orders of the defined types were placed in this encounter.  Patient Instructions  Concussion, Adult  A concussion is a brain injury from a hard, direct hit (trauma) to the head or body. This direct hit causes the brain to shake quickly back and forth inside the skull. This can damage brain cells and cause chemical changes in the brain. A concussion may also be known as a mild traumatic braininjury (TBI). Concussions are usually not life-threatening, but the effects of a concussion can be serious. If you have a concussion, you should be very careful to avoidhaving a second concussion. What are the causes? This condition is caused by: A direct hit to your head, such as: Running into another player during a game. Being hit in a fight. Hitting your  head on a hard surface. Sudden movement of your body that causes your brain to move back and forth inside the skull, such as in a car crash. What are the signs or symptoms? The signs of a concussion can be hard to notice. Early on, they may be missed by you, family members, and health care providers. You may look fine on theoutside but may act or feel differently. Every head injury is different. Symptoms are usually temporary but may last for days, weeks, or even  months. Some symptoms appear right away, but other symptoms may not show up for hours or days. If your symptoms last longer thannormal, you may have post-concussion syndrome. Physical symptoms Headaches. Dizziness and problems with coordination or balance. Sensitivity to light or noise. Nausea or vomiting. Tiredness (fatigue). Vision or hearing problems. Changes in eating or sleeping patterns. Seizure. Mental and emotional symptoms Irritability or mood changes. Memory problems. Trouble concentrating, organizing, or making decisions. Slowness in thinking, acting or reacting, speaking, or reading. Anxiety or depression. How is this diagnosed? This condition is diagnosed based on: Your symptoms. A description of your injury. You may also have tests, including: Imaging tests, such as a CT scan or an MRI. Neuropsychological tests. These measure your thinking, understanding, learning, and remembering abilities. How is this treated? Treatment for this condition includes: Stopping sports or activity if you are injured. If you hit your head or show signs of concussion: Do not return to sports or activities the same day. Get checked by a health care provider before you return to your activities. Physical and mental rest and careful observation, usually at home. Gradually return to your normal activities. Medicines to help with symptoms such as headaches, nausea, or difficulty sleeping. Avoid taking opioid pain medicine while recovering from a concussion. Avoiding alcohol and drugs. These may slow your recovery and can put you at risk of further injury. Referral to a concussion clinic or rehabilitation center. Recovery from a concussion can take time. How fast you recover depends on many factors. Return to activities only when: Your symptoms are completely gone. Your health care provider says that it is safe. Follow these instructions at home: Activity Limit activities that require a lot  of thought or concentration, such as: Doing homework or job-related work. Watching TV. Working on the computer or phone. Playing memory games and puzzles. Rest. Rest helps your brain heal. Make sure you: Get plenty of sleep. Most adults should get 7-9 hours of sleep each night. Rest during the day. Take naps or rest breaks when you feel tired. Avoid physical activity like exercise until your health care provider says it is safe. Stop any activity that worsens symptoms. Do not do high-risk activities that could cause a second concussion, such as riding a bike or playing sports. Ask your health care provider when you can return to your normal activities, such as school, work, athletics, and driving. Your ability to react may be slower after a brain injury. Never do these activities if you are dizzy. Your health care provider will likely give you a plan for gradually returning to activities. General instructions  Take over-the-counter and prescription medicines only as told by your health care provider. Some medicines, such as blood thinners (anticoagulants) and aspirin, may increase the risk for complications, such as bleeding. Do not drink alcohol until your health care provider says you can. Watch your symptoms and tell others around you to do the same. Complications sometimes occur after a concussion. Older adults with  a brain injury may have a higher risk of serious complications. Tell your work Production designer, theatre/television/film, teachers, Tax adviser, school counselor, coach, or Event organiser about your injury, symptoms, and restrictions. Keep all follow-up visits as told by your health care provider. This is important.  How is this prevented? Avoiding another brain injury is very important. In rare cases, another injury can lead to permanent brain damage, brain swelling, or death. The risk of this is greatest during the first 7-10 days after a head injury. Avoid injuries by: Stopping activities that could lead to  a second concussion, such as contact or recreational sports, until your health care provider says it is okay. Taking these actions once you have returned to sports or activities: Avoiding plays or moves that can cause you to crash into another person. This is how most concussions occur. Following the rules and being respectful of other players. Do not engage in violent or illegal plays. Getting regular exercise that includes strength and balance training. Wearing a properly fitting helmet during sports, biking, or other activities. Helmets can help protect you from serious skull and brain injuries, but they may not protect you from a concussion. Even when wearing a helmet, you should avoid being hit in the head. Contact a health care provider if: Your symptoms do not improve. You have new symptoms. You have another injury. Get help right away if: You have new or worsening physical symptoms, such as: A severe or worsening headache. Weakness or numbness in any part of your body, slurred speech, vision changes, or confusion. Your coordination gets worse. Vomiting repeatedly. You have a seizure. You have unusual behavior changes. You lose consciousness, are sleepier than normal, or are difficult to wake up. These symptoms may represent a serious problem that is an emergency. Do not wait to see if the symptoms will go away. Get medical help right away. Call your local emergency services (911 in the [place] Do not drive yourself to the hospital. Summary A concussion is a brain injury that results from a hard, direct hit (trauma) to your head or body. You may have imaging tests and neuropsychological tests to diagnose a concussion. Treatment for this condition includes physical and mental rest and careful observation. Ask your health care provider when you can return to your normal activities, such as school, work, athletics, and driving. Get help right away if you have a severe headache, weakness  in any part of the body, seizures, behavior changes, changes in vision, or if you are confused or sleepier than normal. This information is not intended to replace advice given to you by your health care provider. Make sure you discuss any questions you have with your healthcare provider.     Instructions sent via MyChart.   Follow Up Instructions: No follow-ups on file.    I discussed the assessment and treatment plan with the patient. The patient was provided an opportunity to ask questions and all were answered. The patient agreed with the plan and demonstrated an understanding of the instructions.   The patient was advised to call back or seek an in-person evaluation if any new concerns, if symptoms worsen or if the condition fails to improve as anticipated.  20 minutes of non-face-to-face time was provided during this encounter.      . . . . . . . . . . . . . Marland Kitchen                   Historical information moved  to improve visibility of documentation.  No past medical history on file. Past Surgical History:  Procedure Laterality Date   KNEE SURGERY Left    microdiscectomy Bilateral    Social History   Tobacco Use   Smoking status: Never   Smokeless tobacco: Never  Substance Use Topics   Alcohol use: Yes    Alcohol/week: 3.0 standard drinks    Types: 3 Glasses of wine per week   family history includes Healthy in her father and mother.  Medications: Current Outpatient Medications  Medication Sig Dispense Refill   phentermine 15 MG capsule Take 1 capsule (15 mg total) by mouth daily. 90 capsule 3   propranolol ER (INDERAL LA) 60 MG 24 hr capsule TAKE 1 CAPSULE BY MOUTH EVERYDAY AT BEDTIME 90 capsule 3   topiramate (TOPAMAX) 50 MG tablet Take 1 tablet (50 mg total) by mouth 2 (two) times daily. 180 tablet 3   No current facility-administered medications for this visit.   No Known Allergies   If phone visit, billing and coding can please  add appropriate modifier if needed

## 2021-06-13 NOTE — Patient Instructions (Signed)
Concussion, Adult  A concussion is a brain injury from a hard, direct hit (trauma) to the head or body. This direct hit causes the brain to shake quickly back and forth inside the skull. This can damage brain cells and cause chemical changes in the brain. A concussion may also be known as a mild traumatic brain injury (TBI).  Concussions are usually not life-threatening, but the effects of a concussion can be serious. If you have a concussion, you should be very careful to avoid having a second concussion.  What are the causes?  This condition is caused by:  A direct hit to your head, such as:  Running into another player during a game.  Being hit in a fight.  Hitting your head on a hard surface.  Sudden movement of your body that causes your brain to move back and forth inside the skull, such as in a car crash.  What are the signs or symptoms?  The signs of a concussion can be hard to notice. Early on, they may be missed by you, family members, and health care providers. You may look fine on the outside but may act or feel differently.  Every head injury is different. Symptoms are usually temporary but may last for days, weeks, or even months. Some symptoms appear right away, but other symptoms may not show up for hours or days. If your symptoms last longer than normal, you may have post-concussion syndrome.  Physical symptoms  Headaches.  Dizziness and problems with coordination or balance.  Sensitivity to light or noise.  Nausea or vomiting.  Tiredness (fatigue).  Vision or hearing problems.  Changes in eating or sleeping patterns.  Seizure.  Mental and emotional symptoms  Irritability or mood changes.  Memory problems.  Trouble concentrating, organizing, or making decisions.  Slowness in thinking, acting or reacting, speaking, or reading.  Anxiety or depression.  How is this diagnosed?  This condition is diagnosed based on:  Your symptoms.  A description of your injury.  You may also have tests,  including:  Imaging tests, such as a CT scan or an MRI.  Neuropsychological tests. These measure your thinking, understanding, learning, and remembering abilities.  How is this treated?  Treatment for this condition includes:  Stopping sports or activity if you are injured. If you hit your head or show signs of concussion:  Do not return to sports or activities the same day.  Get checked by a health care provider before you return to your activities.  Physical and mental rest and careful observation, usually at home. Gradually return to your normal activities.  Medicines to help with symptoms such as headaches, nausea, or difficulty sleeping.  Avoid taking opioid pain medicine while recovering from a concussion.  Avoiding alcohol and drugs. These may slow your recovery and can put you at risk of further injury.  Referral to a concussion clinic or rehabilitation center.  Recovery from a concussion can take time. How fast you recover depends on many factors. Return to activities only when:  Your symptoms are completely gone.  Your health care provider says that it is safe.  Follow these instructions at home:  Activity  Limit activities that require a lot of thought or concentration, such as:  Doing homework or job-related work.  Watching TV.  Working on the computer or phone.  Playing memory games and puzzles.  Rest. Rest helps your brain heal. Make sure you:  Get plenty of sleep. Most adults should get 7-9 hours   of sleep each night.  Rest during the day. Take naps or rest breaks when you feel tired.  Avoid physical activity like exercise until your health care provider says it is safe. Stop any activity that worsens symptoms.  Do not do high-risk activities that could cause a second concussion, such as riding a bike or playing sports.  Ask your health care provider when you can return to your normal activities, such as school, work, athletics, and driving. Your ability to react may be slower after a brain injury.  Never do these activities if you are dizzy. Your health care provider will likely give you a plan for gradually returning to activities.  General instructions    Take over-the-counter and prescription medicines only as told by your health care provider. Some medicines, such as blood thinners (anticoagulants) and aspirin, may increase the risk for complications, such as bleeding.  Do not drink alcohol until your health care provider says you can.  Watch your symptoms and tell others around you to do the same. Complications sometimes occur after a concussion. Older adults with a brain injury may have a higher risk of serious complications.  Tell your work manager, teachers, school nurse, school counselor, coach, or athletic trainer about your injury, symptoms, and restrictions.  Keep all follow-up visits as told by your health care provider. This is important.  How is this prevented?  Avoiding another brain injury is very important. In rare cases, another injury can lead to permanent brain damage, brain swelling, or death. The risk of this is greatest during the first 7-10 days after a head injury. Avoid injuries by:  Stopping activities that could lead to a second concussion, such as contact or recreational sports, until your health care provider says it is okay.  Taking these actions once you have returned to sports or activities:  Avoiding plays or moves that can cause you to crash into another person. This is how most concussions occur.  Following the rules and being respectful of other players. Do not engage in violent or illegal plays.  Getting regular exercise that includes strength and balance training.  Wearing a properly fitting helmet during sports, biking, or other activities. Helmets can help protect you from serious skull and brain injuries, but they may not protect you from a concussion. Even when wearing a helmet, you should avoid being hit in the head.  Contact a health care provider if:  Your  symptoms do not improve.  You have new symptoms.  You have another injury.  Get help right away if:  You have new or worsening physical symptoms, such as:  A severe or worsening headache.  Weakness or numbness in any part of your body, slurred speech, vision changes, or confusion.  Your coordination gets worse.  Vomiting repeatedly.  You have a seizure.  You have unusual behavior changes.  You lose consciousness, are sleepier than normal, or are difficult to wake up.  These symptoms may represent a serious problem that is an emergency. Do not wait to see if the symptoms will go away. Get medical help right away. Call your local emergency services (911 in the [place] Do not drive yourself to the hospital.  Summary  A concussion is a brain injury that results from a hard, direct hit (trauma) to your head or body.  You may have imaging tests and neuropsychological tests to diagnose a concussion.  Treatment for this condition includes physical and mental rest and careful observation.  Ask your health

## 2021-07-25 ENCOUNTER — Encounter: Payer: Self-pay | Admitting: Psychiatry

## 2021-07-25 ENCOUNTER — Ambulatory Visit: Payer: No Typology Code available for payment source | Admitting: Psychiatry

## 2021-07-25 VITALS — BP 124/82 | HR 72 | Ht 62.0 in | Wt 141.0 lb

## 2021-07-25 DIAGNOSIS — F0781 Postconcussional syndrome: Secondary | ICD-10-CM | POA: Diagnosis not present

## 2021-07-25 NOTE — Progress Notes (Signed)
[facility]  PATIENT: Patricia Webb DOB: Sep 12, 1971  REFERRING CLINICIAN: Sunnie Nielsen, DO HISTORY FROM: self REASON FOR VISIT: post concussion   HISTORICAL  CHIEF COMPLAINT:  Chief Complaint  Patient presents with   New Patient (Initial Visit)    Rm 1 alone- reports in august she was on vacation and sustained a concession after hitting her head on the pool deck. Pt reports since then she has been doing well though, no lingering symptoms have been noted.     HISTORY OF PRESENT ILLNESS:  The patient fell in August 2022 when she was on vacation in [place]. [place]. She was walking on a pool deck and slipped and hit her head. Lost consciousness and spent 2 days in the hospital. [facility] was unremarkable. She does not remember ~6 hours surrounding the event.  She had persistent headaches for roughly one week after the fall. She has been asymptomatic since then. Denies headaches, dizziness, memory or cognition issues, mood changes, or insomnia.  OTHER MEDICAL CONDITIONS: HTN   REVIEW OF SYSTEMS: Full 14 system review of systems performed and negative with exception of: none  ALLERGIES: No Known Allergies  HOME MEDICATIONS: Outpatient Medications Prior to Visit  Medication Sig Dispense Refill   phentermine 15 MG capsule Take 1 capsule (15 mg total) by mouth daily. 90 capsule 3   propranolol ER (INDERAL LA) 60 MG 24 hr capsule TAKE 1 CAPSULE BY MOUTH EVERYDAY AT BEDTIME 90 capsule 3   topiramate (TOPAMAX) 50 MG tablet Take 1 tablet (50 mg total) by mouth 2 (two) times daily. 180 tablet 3   No facility-administered medications prior to visit.    PAST MEDICAL HISTORY: Past Medical History:  Diagnosis Date   Concussion     PAST SURGICAL HISTORY: Past Surgical History:  Procedure Laterality Date   KNEE SURGERY Left    microdiscectomy Bilateral     FAMILY HISTORY: Family History  Problem Relation Age of Onset   Healthy Mother    Healthy Father      SOCIAL HISTORY: Social History   Socioeconomic History   Marital status: Married    Spouse name: Not on file   Number of children: Not on file   Years of education: Not on file   Highest education level: Not on file  Occupational History   Occupation: ACCOUNTANT     Employer: [employer]   Tobacco Use   Smoking status: Never   Smokeless tobacco: Never  Vaping Use   Vaping Use: Never used  Substance and Sexual Activity   Alcohol use: Yes    Alcohol/week: 3.0 standard drinks    Types: 3 Glasses of wine per week    Comment: Drinks on the weekend   Drug use: Never   Sexual activity: Yes    Partners: Male    Birth control/protection: Pill  Other Topics Concern   Not on file  Social History Narrative   Left handed   Lives at home with spouse    Caffeine on occasion.    Social Determinants of Health   Financial Resource Strain: Not on file  Food Insecurity: Not on file  Transportation Needs: Not on file  Physical Activity: Not on file  Stress: Not on file  Social Connections: Not on file  Intimate Partner Violence: Not on file     PHYSICAL EXAM  GENERAL EXAM/CONSTITUTIONAL: Vitals:  Vitals:   07/25/21 0853  BP: 124/82  Pulse: 72  SpO2: 99%  Weight: 141 lb (64 kg)  Height: 5\' 2"  (1.575 m)   Body mass index is 25.79 kg/m. Wt Readings from Last 3 Encounters:  07/25/21 141 lb (64 kg)  06/13/21 139 lb 9.6 oz (63.3 kg)  04/18/21 140 lb 1.9 oz (63.6 kg)   Patient is in no distress; well developed, nourished and groomed; neck is supple  CARDIOVASCULAR: Regular rate and rhythm, no murmurs Examination of peripheral vascular system by observation and palpation is normal  EYES: Pupils round and reactive to light, Visual fields full to confrontation, Extraocular movements intacts,   MUSCULOSKELETAL: Gait, strength, tone, movements noted in Neurologic exam below  NEUROLOGIC: MENTAL STATUS:  awake, alert, oriented to person, place and  time recent and remote memory intact normal attention and concentration language fluent, comprehension intact, naming intact fund of knowledge appropriate  CRANIAL NERVE:  2nd, 3rd, 4th, 6th - pupils equal and reactive to light, visual fields full to confrontation, extraocular muscles intact, no nystagmus 5th - facial sensation symmetric 7th - facial strength symmetric 8th - hearing intact 9th - palate elevates symmetrically, uvula midline 11th - shoulder shrug symmetric 12th - tongue protrusion midline  MOTOR:  normal bulk and tone, full strength in the BUE, BLE  SENSORY:  normal and symmetric to light touch all 4 extremities  COORDINATION:  finger-nose-finger  REFLEXES:  deep tendon reflexes present and symmetric  GAIT/STATION:  normal     DIAGNOSTIC DATA (LABS, IMAGING, TESTING) - I reviewed patient records, labs, notes, testing and imaging myself where available.  [facility] personally reviewed with no acute intracranial abnormality noted  Lab Results  Component Value Date   WBC 7.3 12/13/2018   HGB 15.1 12/13/2018   HCT 43.5 12/13/2018   MCV 90.1 12/13/2018   PLT 324 12/13/2018      Component Value Date/Time   NA 140 12/13/2018 1140   K 4.0 12/13/2018 1140   CL 105 12/13/2018 1140   CO2 24 12/13/2018 1140   GLUCOSE 86 12/13/2018 1140   BUN 9 12/13/2018 1140   CREATININE 0.62 12/13/2018 1140   CALCIUM 10.1 12/13/2018 1140   PROT 7.3 12/13/2018 1140   AST 19 12/13/2018 1140   ALT 17 12/13/2018 1140   BILITOT 0.3 12/13/2018 1140   GFRNONAA 109 12/13/2018 1140   GFRAA 126 12/13/2018 1140   No results found for: CHOL, HDL, LDLCALC, LDLDIRECT, TRIG, CHOLHDL No results found for: 12/15/2018 No results found for: VITAMINB12 Lab Results  Component Value Date   TSH 1.63 12/13/2018     ASSESSMENT AND PLAN  50 y.o. year old female with a history of HTN who presents for evaluation following a concussion in August 2022. CTH at that time was unremarkable and  neurological exam today is normal. She is currently asymptomatic. Discussed post-concussion symptoms and prognosis. Advised to return to clinic if she notes worsening headaches or new neurologic symptoms.   1. Post concussion syndrome       PLAN: -No further workup indicated at this time -Return to clinic as needed if new symptoms develop   Return if symptoms worsen or fail to improve.    September 2022, MD 07/25/2021 9:44 AM  [facility] [address] [place], [place] [phone]

## 2021-07-25 NOTE — Patient Instructions (Signed)
Post Concussive Syndrome:  Post-concussion syndrome is a complex disorder in which various symptoms -- such as headaches and dizziness -- last for weeks and sometimes months after the injury that caused the concussion. Concussion is a mild traumatic brain injury, usually occurring after a blow to the head. Loss of consciousness isn't required for a diagnosis of concussion or post-concussion syndrome. In fact, the risk of post-concussion syndrome doesn't appear to be associated with the severity of the initial injury. In most people, post-concussion syndrome symptoms occur within the first seven to 10 days and go away within three months, though they can persist for a year or more. Post-concussion syndrome treatments are aimed at easing specific symptoms.  Post-concussion symptoms include: Headaches  Dizziness  Fatigue  Irritability  Anxiety  Insomnia  Loss of concentration and memory  Noise and light sensitivity  Headaches that occur after a concussion can vary and may feel like tension-type headaches or migraines. Most, however, are tension-type headaches, which may be associated with a neck injury that happened at the same time as the head injury. In some cases, people experience behavior or emotional changes after a mild traumatic brain injury. Family members may notice that the person has become more irritable, suspicious, argumentative or stubborn. When to see a doctor See a doctor if you experience a head injury severe enough to cause confusion or amnesia -- even if you never lost consciousness. If a concussion occurs while you're playing a sport, don't go back in the game. Seek medical attention so that you don't risk worsening your injury. Causes: Some experts believe post-concussion symptoms are caused by structural damage to the brain or disruption of neurotransmitter systems, resulting from the impact that caused the concussion. Others believe post-concussion symptoms are related  to psychological factors, especially since the most common symptoms -- headache, dizziness and sleep problems -- are similar to those often experienced by people diagnosed with depression, anxiety or post-traumatic stress disorder. In many cases, both physiological effects of brain trauma and emotional reactions to these effects play a role in the development of symptoms. Researchers haven't determined why some people who've had concussions develop persistent post-concussion symptoms while others do not. No proven correlation between the severity of the injury and the likelihood of developing persistent post-concussion symptoms exists.  Risk Factors: Risk factors for developing post-concussion syndrome include: Age. Studies have found increasing age to be a risk factor for post-concussion syndrome.  Sex. Women are more likely to be diagnosed with post-concussion syndrome, but this may be because women are generally more likely to seek medical care.  Trauma. Concussions resulting from car collisions, falls, assaults and sports injuries are commonly associated with post-concussion syndrome. Treatment: There is no specific treatment for post-concussion syndrome. Instead, your doctor will treat the individual symptoms you're experiencing. The types of symptoms and their frequency are unique to each person. Headaches Medications commonly used for migraines or tension headaches, including some antidepressants, appear to be effective when these types of headaches are associated with post-concussion syndrome. Examples include: Amitriptyline. This medication has been widely used for post-traumatic injuries, as well as for symptoms commonly associated with post-concussion syndrome, such as irritability, dizziness and depression. Topiramate. Commonly used to treat migraines, topiramate (Qudexy XR, Topamax, Trokendi XR) may be effective in reducing headaches after head injury. Common side effects of topiramate  include weight loss and cognitive problems.  Gabapentin. Gabapentin (Gralise, Neurontin) is frequently used to treat a variety of types of pain and may be helpful in  treating post-traumatic headaches. A common side effect of gabapentin is drowsiness.  Other agents used to treat migraines and tension-type headaches may also be helpful in some individuals. Keep in mind that the overuse of over-the-counter and prescription pain relievers may contribute to persistent post-concussion headaches. Memory and thinking problems No medications are currently recommended specifically for the treatment of cognitive problems after mild traumatic brain injury. Time may be the best therapy for post-concussion syndrome if you have cognitive problems, as most of them go away on their own in the weeks to months following the injury. Certain forms of cognitive therapy may be helpful, including focused rehabilitation that provides training in how to use a pocket calendar, Education officer, community or other techniques to work around memory deficits and attention skills. Relaxation therapy also may help. Dizziness Vestibular rehab (a specialized form of physical therapy can help this. Depression and anxiety The symptoms of post-concussion syndrome often improve after the affected person learns that there is a cause for his or her symptoms and that they will likely improve with time. Education about the disorder can ease a person's fears and help provide peace of mind. If you're experiencing new or increasing depression or anxiety after a concussion, some treatment options include: Psychotherapy. It may be helpful to discuss your concerns with a psychologist or psychiatrist who has experience in working with people with brain injury.  Medication. To combat anxiety or depression, antidepressants or anti-anxiety medications may be prescribed. Prevention: The only known way to prevent post-concussion syndrome is to avoid the head  injury in the first place. Avoiding head injuries Although you can't prepare for every potential situation, here are some tips for avoiding common causes of head injuries: Fasten your seat belt whenever you're traveling in a car, and be sure children are in age-appropriate safety seats. Children under 13 are safest riding in the back seat, especially if your car has air bags.  Use helmets whenever you or your children are bicycling, roller-skating, in-line skating, ice-skating, skiing, snowboarding, playing football, batting or running the bases in softball or baseball, skateboarding, or horseback riding. Wear a helmet when riding a motorcycle.  Take steps around the house to prevent falls, such as removing small area rugs, improving lighting and installing handrails.

## 2021-09-24 ENCOUNTER — Telehealth: Payer: Self-pay

## 2021-09-24 NOTE — Telephone Encounter (Signed)
Pt is a former pt of Dr. Lyn Hollingshead. She states that when she saw Dr. Lyn Hollingshead on 04/18/2021 and the Rx's phentermine and topiramate were sent to her pharmacy for #90 day supplies with refills for a year. She was also told that she needed to f/u in 1 year from that OV. She is wanting to know if someone will continue prescribing her meds, especially phentermine since it is a controlled substance, now that Dr. Lyn Hollingshead has left and she was told she did not need to f/u until 04/18/2022. I told her that we could send in a 1 month supply but she would have to schedule an appt to transfer care to a new PCP in our office. I told her once she scheduled the appt I would send a message to the new provider and see if they would authorize Rx's to bridge her to the appt. The call was transferred to the front desk and she has an OV scheduled for 11/04/21 at 4:00 PM with Christen Butter, FNP. I called [facility] to ask about the Rx's and was told that her profile shows refills for both phentermine and topiramate, and that she just picked up #90 day refills for each one on 08/18/21, which means the pt has enough meds to bridge her to her upcoming appt. Pt aware of this info via MyChart message.

## 2021-11-04 ENCOUNTER — Ambulatory Visit: Payer: No Typology Code available for payment source | Admitting: Medical-Surgical

## 2022-04-21 NOTE — Progress Notes (Signed)
Erroneous encounter. Please disregard.

## 2022-04-22 ENCOUNTER — Encounter: Payer: No Typology Code available for payment source | Admitting: Medical-Surgical

## 2022-04-22 ENCOUNTER — Encounter: Payer: No Typology Code available for payment source | Admitting: Osteopathic Medicine

## 2022-04-22 DIAGNOSIS — Z0000 Encounter for general adult medical examination without abnormal findings: Secondary | ICD-10-CM

## 2022-04-22 DIAGNOSIS — Z1211 Encounter for screening for malignant neoplasm of colon: Secondary | ICD-10-CM

## 2022-04-22 DIAGNOSIS — Z7689 Persons encountering health services in other specified circumstances: Secondary | ICD-10-CM

## 2022-04-22 DIAGNOSIS — Z1231 Encounter for screening mammogram for malignant neoplasm of breast: Secondary | ICD-10-CM

## 2022-04-22 DIAGNOSIS — Z1329 Encounter for screening for other suspected endocrine disorder: Secondary | ICD-10-CM

## 2022-05-30 ENCOUNTER — Encounter: Payer: Self-pay | Admitting: Medical-Surgical

## 2022-05-30 ENCOUNTER — Ambulatory Visit (INDEPENDENT_AMBULATORY_CARE_PROVIDER_SITE_OTHER): Payer: 59 | Admitting: Medical-Surgical

## 2022-05-30 VITALS — BP 130/90 | HR 72 | Resp 20 | Ht 62.0 in | Wt 149.3 lb

## 2022-05-30 DIAGNOSIS — F419 Anxiety disorder, unspecified: Secondary | ICD-10-CM

## 2022-05-30 DIAGNOSIS — R635 Abnormal weight gain: Secondary | ICD-10-CM

## 2022-05-30 DIAGNOSIS — R030 Elevated blood-pressure reading, without diagnosis of hypertension: Secondary | ICD-10-CM

## 2022-05-30 DIAGNOSIS — Z0000 Encounter for general adult medical examination without abnormal findings: Secondary | ICD-10-CM | POA: Diagnosis not present

## 2022-05-30 DIAGNOSIS — Z1211 Encounter for screening for malignant neoplasm of colon: Secondary | ICD-10-CM

## 2022-05-30 MED ORDER — PROPRANOLOL HCL ER 60 MG PO CP24: ORAL_CAPSULE | ORAL | 3 refills | Status: DC

## 2022-05-30 MED ORDER — TOPIRAMATE 50 MG PO TABS: 50.0000 mg | ORAL_TABLET | Freq: Two times a day (BID) | ORAL | 3 refills | Status: DC

## 2022-05-30 MED ORDER — METHOCARBAMOL 500 MG PO TABS: 500.0000 mg | ORAL_TABLET | Freq: Three times a day (TID) | ORAL | 0 refills | Status: DC | PRN

## 2022-05-30 NOTE — Progress Notes (Signed)
Complete physical exam  Patient: Patricia Webb   DOB: 02/04/1971   51 y.o. Female  MRN: [id number]  Subjective:    Chief Complaint  Patient presents with   Annual Exam   Transitions Of Care    Patricia Webb is a 51 y.o. female who presents today for a complete physical exam. She reports consuming a general and portion  diet.  Prefers walking for exercise, usually done for 1 hour 3-5 days/week.   She generally feels well. She reports sleeping poorly. She does not have additional problems to discuss today.    Most recent fall risk assessment:    04/18/2021    2:07 PM  Fall Risk   Falls in the past year? 0  Number falls in past yr: 0  Injury with Fall? 0  Risk for fall due to : No Fall Risks  Follow up Falls evaluation completed     Most recent depression screenings:    04/18/2021    2:07 PM 09/14/2019    8:42 AM  PHQ 2/9 Scores  PHQ - 2 Score 0 0  PHQ- 9 Score  1    Vision:Within last year, Dental: No current dental problems and Receives regular dental care, and STD: The patient denies history of sexually transmitted disease.    Patient Care Team: Christen ButterJessup, Azael Ragain, NP as PCP - General (Nurse Practitioner)   Outpatient Medications Prior to Visit  Medication Sig   phentermine 15 MG capsule Take 1 capsule (15 mg total) by mouth daily.   [DISCONTINUED] propranolol ER (INDERAL LA) 60 MG 24 hr capsule TAKE 1 CAPSULE BY MOUTH EVERYDAY AT BEDTIME   [DISCONTINUED] topiramate (TOPAMAX) 50 MG tablet Take 1 tablet (50 mg total) by mouth 2 (two) times daily.   No facility-administered medications prior to visit.    Review of Systems  Constitutional:  Negative for chills, fever, malaise/fatigue and weight loss.  HENT:  Negative for congestion, ear pain, hearing loss, sinus pain and sore throat.   Eyes:  Negative for blurred vision, photophobia and pain.  Respiratory:  Negative for cough, shortness of breath and wheezing.   Cardiovascular:  Negative for chest pain,  palpitations and leg swelling.  Gastrointestinal:  Negative for abdominal pain, constipation, diarrhea, heartburn, nausea and vomiting.  Genitourinary:  Negative for dysuria, frequency and urgency.  Musculoskeletal:  Positive for back pain and joint pain. Negative for falls and neck pain.  Skin:  Negative for itching and rash.  Neurological:  Negative for dizziness, weakness and headaches.  Endo/Heme/Allergies:  Negative for polydipsia. Does not bruise/bleed easily.  Psychiatric/Behavioral:  Negative for depression, substance abuse and suicidal ideas. The patient is nervous/anxious and has insomnia.      Objective:     BP (!) 130/90 (BP Location: Right Arm, Cuff Size: Normal)   Pulse 72   Resp 20   Ht 5\' 2"  (1.575 m)   Wt 149 lb 4.8 oz (67.7 kg)   SpO2 98%   BMI 27.31 kg/m    Physical Exam Constitutional:      General: She is not in acute distress.    Appearance: Normal appearance. She is not ill-appearing.  HENT:     Head: Normocephalic and atraumatic.     Right Ear: Tympanic membrane, ear canal and external ear normal. There is no impacted cerumen.     Left Ear: Tympanic membrane, ear canal and external ear normal. There is no impacted cerumen.     Nose: Nose normal.     Mouth/Throat:  Mouth: Mucous membranes are moist.     Pharynx: No oropharyngeal exudate or posterior oropharyngeal erythema.  Eyes:     General: No scleral icterus.       Right eye: No discharge.        Left eye: No discharge.     Extraocular Movements: Extraocular movements intact.     Conjunctiva/sclera: Conjunctivae normal.     Pupils: Pupils are equal, round, and reactive to light.  Neck:     Thyroid: No thyromegaly.     Vascular: No carotid bruit or JVD.     Trachea: Trachea normal.  Cardiovascular:     Rate and Rhythm: Normal rate and regular rhythm.     Pulses: Normal pulses.     Heart sounds: Normal heart sounds. No murmur heard.    No friction rub. No gallop.  Pulmonary:     Effort:  Pulmonary effort is normal. No respiratory distress.     Breath sounds: Normal breath sounds. No wheezing.  Abdominal:     General: Bowel sounds are normal. There is no distension.     Palpations: Abdomen is soft.     Tenderness: There is no abdominal tenderness. There is no guarding.  Musculoskeletal:        General: Normal range of motion.     Cervical back: Normal range of motion and neck supple.  Skin:    General: Skin is warm and dry.  Neurological:     Mental Status: She is alert and oriented to person, place, and time.     Cranial Nerves: No cranial nerve deficit.  Psychiatric:        Mood and Affect: Mood normal.        Behavior: Behavior normal.        Thought Content: Thought content normal.        Judgment: Judgment normal.    No results found for any visits on 05/30/22.     Assessment & Plan:    Routine Health Maintenance and Physical Exam  Immunization History  Administered Date(s) Administered   Tdap 10/27/2008    Health Maintenance  Topic Date Due   HIV Screening  Never done   Hepatitis C Screening  Never done   COLONOSCOPY (Pts 45-83yrs Insurance coverage will need to be confirmed)  Never done   MAMMOGRAM  01/04/2022   INFLUENZA VACCINE  06/30/2022 (Originally 05/27/2022)   COVID-19 Vaccine (1) 10/18/2022 (Originally 07/25/1971)   TETANUS/TDAP  05/31/2023 (Originally 10/27/2018)   PAP SMEAR-Modifier  09/13/2022   HPV VACCINES  Aged Out    Discussed health benefits of physical activity, and encouraged her to engage in regular exercise appropriate for her age and condition.  1. Annual physical exam Checking labs as below.  Up-to-date on preventative care.  Wellness information provided with AVS. - CBC with Differential/Platelet - COMPLETE METABOLIC PANEL WITH GFR - Lipid panel  2. Colon cancer screening Referring to GI for colonoscopy. - Ambulatory referral to Gastroenterology  3. Abnormal weight gain Previously took phentermine for over a year  along with Topamax.  Since there is no long-term safety data for phentermine, we will restart with Topamax 50 mg twice daily to see if this is beneficial before considering adding the phentermine back. - topiramate (TOPAMAX) 50 MG tablet; Take 1 tablet (50 mg total) by mouth 2 (two) times daily.  Dispense: 180 tablet; Refill: 3  4. Anxiety 5. Elevated blood pressure reading Continue propranolol 60 mg daily. - propranolol ER (INDERAL LA) 60 MG 24  hr capsule; TAKE 1 CAPSULE BY MOUTH EVERYDAY AT BEDTIME  Dispense: 90 capsule; Refill: 3  Return in about 1 year (around 05/31/2023) for annual physical exam.   Christen Butter, NP

## 2022-06-26 ENCOUNTER — Other Ambulatory Visit: Payer: Self-pay

## 2022-06-26 ENCOUNTER — Encounter: Payer: Self-pay | Admitting: Medical-Surgical

## 2022-06-26 DIAGNOSIS — R030 Elevated blood-pressure reading, without diagnosis of hypertension: Secondary | ICD-10-CM

## 2022-06-26 MED ORDER — PROPRANOLOL HCL ER 60 MG PO CP24: ORAL_CAPSULE | ORAL | 0 refills | Status: DC

## 2022-07-09 MED ORDER — PROPRANOLOL HCL ER 60 MG PO CP24: ORAL_CAPSULE | ORAL | 1 refills | Status: DC

## 2022-08-17 ENCOUNTER — Encounter: Payer: Self-pay | Admitting: Medical-Surgical

## 2022-08-17 DIAGNOSIS — R635 Abnormal weight gain: Secondary | ICD-10-CM

## 2022-08-18 MED ORDER — TOPIRAMATE 50 MG PO TABS: 50.0000 mg | ORAL_TABLET | Freq: Two times a day (BID) | ORAL | 3 refills | Status: DC

## 2022-09-12 ENCOUNTER — Encounter: Payer: Self-pay | Admitting: Medical-Surgical

## 2022-09-12 ENCOUNTER — Telehealth (INDEPENDENT_AMBULATORY_CARE_PROVIDER_SITE_OTHER): Payer: 59 | Admitting: Medical-Surgical

## 2022-09-12 DIAGNOSIS — F419 Anxiety disorder, unspecified: Secondary | ICD-10-CM

## 2022-09-12 DIAGNOSIS — N939 Abnormal uterine and vaginal bleeding, unspecified: Secondary | ICD-10-CM

## 2022-09-12 DIAGNOSIS — Z780 Asymptomatic menopausal state: Secondary | ICD-10-CM | POA: Diagnosis not present

## 2022-09-12 MED ORDER — TRANEXAMIC ACID 650 MG PO TABS: 1300.0000 mg | ORAL_TABLET | Freq: Three times a day (TID) | ORAL | 0 refills | Status: DC | PRN

## 2022-09-12 NOTE — Progress Notes (Signed)
Virtual Visit via Video Note  I connected with Patricia Webb on 09/12/22 at  1:00 PM EST by a video enabled telemedicine application and verified that I am speaking with the correct person using two identifiers.   I discussed the limitations of evaluation and management by telemedicine and the availability of in person appointments. The patient expressed understanding and agreed to proceed.  Patient location: home Provider locations: office  Subjective:    CC: heavy menstrual bleeding  HPI: Pleasant 51 year old female presenting today via MyChart video visit with reports of abnormal menstrual bleeding. Prior to this episode, her last menstrual cycle was in October of 2022. Approximately 11 days ago, she began to experience heavy menstrual bleeding, saturating 4-5 large pads per day. Her bleeding has been accompanied by severe lower abdominal cramping. She is married in a monogamous relationship with her husband and reports that he travels a lot so there is not frequent sexual activity. They do not use condoms since he had a vasectomy 3 years ago. Notes that he never went back for confirmation of sterility as instructed. No other additional symptoms although she did have some dizziness after several days of heavy bleeding. Over the last couple of days the dizziness has subsided and her bleeding has started to slow down. She is now only using about 1 pad per day. Notes that most of the bleeding is noted in the morning when she gets up to use the bathroom and then it is lighter during the day. Took hydrocodone that she had at home for the pain and it was helpful but Tylenol/Ibuprofen didn't touch her discomfort level.   Past medical history, Surgical history, Family history not pertinant except as noted below, Social history, Allergies, and medications have been entered into the medical record, reviewed, and corrections made.   Review of Systems: See HPI for pertinent positives and negatives.    Objective:    General: Speaking clearly in complete sentences without any shortness of breath.  Alert and oriented x3.  Normal judgment. No apparent acute distress.  Impression and Recommendations:    1. Abnormal uterine bleeding (AUB) 2. Postmenopausal No bleeding since October of 2022 would put her just over the 1 year mark to consider this postmenopausal bleeding however it is right at the border. Unclear etiology for the bleeding and cramping. Checking labs as below. Getting a TVUS for further evaluation. Referring to OBGYN. - FSH/LH - Prolactin - Estradiol - Progesterone - TSH - US Pelvic Complete With Transvaginal; Future - CBC with Differential/Platelet - Beta HCG, Quant - Ambulatory referral to Obstetrics / Gynecology  3. Anxiety Requesting a referral to counseling to help her get connected with a therapist.  - Ambulatory referral to Behavioral Health  I discussed the assessment and treatment plan with the patient. The patient was provided an opportunity to ask questions and all were answered. The patient agreed with the plan and demonstrated an understanding of the instructions.   The patient was advised to call back or seek an in-person evaluation if the symptoms worsen or if the condition fails to improve as anticipated.  25 minutes of non-face-to-face time was provided during this encounter.  Return for further evaluation pending lab/imaging results.  Thayer Ohm, DNP, APRN, FNP-BC [facility] and [facility] Medicine

## 2022-09-17 ENCOUNTER — Ambulatory Visit (INDEPENDENT_AMBULATORY_CARE_PROVIDER_SITE_OTHER): Payer: 59

## 2022-09-17 DIAGNOSIS — Z780 Asymptomatic menopausal state: Secondary | ICD-10-CM | POA: Diagnosis not present

## 2022-09-17 DIAGNOSIS — N939 Abnormal uterine and vaginal bleeding, unspecified: Secondary | ICD-10-CM | POA: Diagnosis not present

## 2022-09-23 ENCOUNTER — Encounter: Payer: Self-pay | Admitting: Medical-Surgical

## 2022-09-23 ENCOUNTER — Other Ambulatory Visit: Payer: Self-pay

## 2022-09-23 DIAGNOSIS — N939 Abnormal uterine and vaginal bleeding, unspecified: Secondary | ICD-10-CM

## 2022-09-23 DIAGNOSIS — Z780 Asymptomatic menopausal state: Secondary | ICD-10-CM

## 2022-09-24 LAB — CBC WITH DIFFERENTIAL/PLATELET
Absolute Monocytes: 317 cells/uL (ref 200–950)
Basophils Absolute: 31 cells/uL (ref 0–200)
Basophils Relative: 0.5 %
Eosinophils Absolute: 98 cells/uL (ref 15–500)
Eosinophils Relative: 1.6 %
HCT: 43.1 % (ref 35.0–45.0)
Hemoglobin: 14.7 g/dL (ref 11.7–15.5)
Lymphs Abs: 2220 cells/uL (ref 850–3900)
MCH: 32.2 pg (ref 27.0–33.0)
MCHC: 34.1 g/dL (ref 32.0–36.0)
MCV: 94.3 fL (ref 80.0–100.0)
MPV: 11 fL (ref 7.5–12.5)
Monocytes Relative: 5.2 %
Neutro Abs: 3434 cells/uL (ref 1500–7800)
Neutrophils Relative %: 56.3 %
Platelets: 302 10*3/uL (ref 140–400)
RBC: 4.57 10*6/uL (ref 3.80–5.10)
RDW: 13.2 % (ref 11.0–15.0)
Total Lymphocyte: 36.4 %
WBC: 6.1 10*3/uL (ref 3.8–10.8)

## 2022-09-24 LAB — FSH/LH
FSH: 102.4 m[IU]/mL
LH: 31 m[IU]/mL

## 2022-09-24 LAB — ESTRADIOL: Estradiol: <15 pg/mL

## 2022-09-24 LAB — PROGESTERONE: Progesterone: <0.5 ng/mL

## 2022-09-24 LAB — PROLACTIN: Prolactin: 8.4 ng/mL

## 2022-09-24 LAB — HCG, TOTAL, QUANTITATIVE: hCG, Beta Chain, Quant, S: <5 m[IU]/mL

## 2022-09-24 LAB — TSH: TSH: 1.74 mIU/L

## 2022-09-26 ENCOUNTER — Encounter: Payer: Self-pay | Admitting: Medical-Surgical

## 2022-10-25 ENCOUNTER — Ambulatory Visit: Admit: 2022-10-25 | Payer: No Typology Code available for payment source

## 2022-10-28 NOTE — Progress Notes (Unsigned)
GYNECOLOGY OFFICE VISIT NOTE  History:   Patricia Webb is a 52 y.o. G0 here today for PMB and abnormal [place]. Her US showed EL 46mm as well as some intramural fibroids. She also had a 1.7 cm simple cyst of the left ovary but no follow up is needed for this. She had an [facility] on 11/28 which was c/w menopause.      Past Medical History:  Diagnosis Date   Concussion    GERD (gastroesophageal reflux disease)    History of tear of ACL (anterior cruciate ligament) 08/07/2015   Hypertension     Past Surgical History:  Procedure Laterality Date   KNEE SURGERY Left    microdiscectomy Bilateral     The following portions of the patient's history were reviewed and updated as appropriate: allergies, current medications, past family history, past medical history, past social history, past surgical history and problem list.   Health Maintenance:   Normal pap and negative HRHPV:  Diagnosis  Date Value Ref Range Status  09/14/2019   Final   - Negative for intraepithelial lesion or malignancy (NILM)     Normal mammogram on 01/04/2021.   Review of Systems:  Pertinent items noted in HPI and remainder of comprehensive ROS otherwise negative.  Physical Exam:  BP 139/88   Pulse 72   Ht 5\' 2"  (1.575 m)   Wt 153 lb (69.4 kg)   LMP 10/12/2022   BMI 27.98 kg/m  CONSTITUTIONAL: Well-developed, well-nourished female in no acute distress.  HEENT:  Normocephalic, atraumatic. External right and left ear normal. No scleral icterus.  NECK: Normal range of motion, supple, no masses noted on observation SKIN: No rash noted. Not diaphoretic. No erythema. No pallor. MUSCULOSKELETAL: Normal range of motion. No edema noted. NEUROLOGIC: Alert and oriented to person, place, and time. Normal muscle tone coordination. No cranial nerve deficit noted. PSYCHIATRIC: Normal mood and affect. Normal behavior. Normal judgment and thought content.  CARDIOVASCULAR: Normal heart rate noted RESPIRATORY: Effort and  breath sounds normal, no problems with respiration noted ABDOMEN: No masses noted. No other overt distention noted.    PELVIC: Normal appearing external genitalia; normal urethral meatus; normal appearing vaginal mucosa and cervix.  No abnormal discharge noted.  Normal uterine size, no other palpable masses, no uterine or adnexal tenderness. Performed in the presence of a chaperone     GYNECOLOGY OFFICE PROCEDURE NOTE   Patricia Webb is a 52 y.o. No obstetric history on file. here for endometrial biopsy for PMB. Recent ultrasound also showed 58mm lining.  Today, she reports no concerning symptoms. Of note, pap on 09/14/2019 was wnl, negative HPV.   ENDOMETRIAL BIOPSY     The indications for endometrial biopsy were reviewed.   Risks of the biopsy including cramping, bleeding, infection, uterine perforation, inadequate specimen and need for additional procedures were discussed. Offered alternative of hysteroscopy, dilation and curettage in OR. The patient states she understands the R/B/I/A and agrees to undergo procedure today. Urine pregnancy test was Not indicated. Consent was signed. Time out was performed.    Patient was positioned in dorsal lithotomy position. A vaginal speculum was placed.  The cervix was visualized and was prepped with Betadine.  A single-toothed tenaculum was placed on the anterior lip of the cervix to stabilize it. Even after using os finders, I was unable to enter her cavity. Offered additional attempts vs OR and pt preferred OR.  The instruments were removed from the patient's vagina. Minimal bleeding from the cervix was noted. The  patient tolerated the procedure well.    Assessment and Plan:   1. PMB (postmenopausal bleeding) Unsuccessful EMB. Will go to OR. Reviewed risks/benefits. Reviewed expectations for management pending results. Reviewed D&C hysteroscopy would be both diagnostic and may be therapeutic if polyp present. Message sent to scheduler.    Routine  preventative health maintenance measures emphasized. Please refer to After Visit Summary for other counseling recommendations.   No follow-ups on file.  Radene Gunning, MD, [place] for [facility], [place]

## 2022-10-29 ENCOUNTER — Ambulatory Visit (INDEPENDENT_AMBULATORY_CARE_PROVIDER_SITE_OTHER): Payer: 59 | Admitting: Obstetrics and Gynecology

## 2022-10-29 ENCOUNTER — Encounter: Payer: Self-pay | Admitting: Obstetrics and Gynecology

## 2022-10-29 VITALS — BP 139/88 | HR 72 | Ht 62.0 in | Wt 153.0 lb

## 2022-10-29 DIAGNOSIS — N950 Postmenopausal bleeding: Secondary | ICD-10-CM | POA: Diagnosis not present

## 2022-10-29 LAB — POCT URINE PREGNANCY: Preg Test, Ur: NEGATIVE

## 2022-11-03 ENCOUNTER — Encounter: Payer: Self-pay | Admitting: Obstetrics and Gynecology

## 2022-11-03 ENCOUNTER — Encounter (HOSPITAL_COMMUNITY): Payer: Self-pay | Admitting: Obstetrics and Gynecology

## 2022-11-03 NOTE — Progress Notes (Signed)
PCP - Samuel Bouche, NP Cardiologist - n/a  Chest x-ray - n/a EKG - DOS Stress Test - n/a ECHO - n/a Cardiac Cath - n/a  ICD Pacemaker/Loop - n/a  Sleep Study -  n/a CPAP - none  Diabetes - none  Anesthesia review:   STOP now taking any Aspirin (unless otherwise instructed by your surgeon), Aleve, Naproxen, Ibuprofen, Motrin, Advil, Goody's, BC's, all herbal medications, fish oil, and all vitamins.   Coronavirus Screening Do you have any of the following symptoms:  Cough yes/no: No Fever (>100.5F)  yes/no: No Runny nose yes/no: No Sore throat yes/no: No Difficulty breathing/shortness of breath  yes/no: No  Have you traveled in the last 14 days and where? yes/no: No  Patient verbalized understanding of instructions that were given via phone.

## 2022-11-04 ENCOUNTER — Ambulatory Visit (HOSPITAL_COMMUNITY): Admission: RE | Admit: 2022-11-04 | Payer: 59 | Source: Home / Self Care | Admitting: Obstetrics and Gynecology

## 2022-11-04 ENCOUNTER — Ambulatory Visit
Admission: EM | Admit: 2022-11-04 | Discharge: 2022-11-04 | Disposition: A | Discharge status: Home / Self Care | Payer: 59 | Attending: Family Medicine | Admitting: Family Medicine

## 2022-11-04 DIAGNOSIS — R059 Cough, unspecified: Secondary | ICD-10-CM | POA: Diagnosis not present

## 2022-11-04 DIAGNOSIS — H6692 Otitis media, unspecified, left ear: Secondary | ICD-10-CM

## 2022-11-04 DIAGNOSIS — U071 COVID-19: Secondary | ICD-10-CM

## 2022-11-04 HISTORY — DX: Anxiety disorder, unspecified: F41.9

## 2022-11-04 LAB — POC SARS CORONAVIRUS 2 AG -  ED: SARS Coronavirus 2 Ag: POSITIVE — AB

## 2022-11-04 LAB — POCT INFLUENZA A/B
Influenza A, POC: NEGATIVE
Influenza B, POC: NEGATIVE

## 2022-11-04 SURGERY — DILATATION AND CURETTAGE /HYSTEROSCOPY
Anesthesia: Choice

## 2022-11-04 MED ORDER — NIRMATRELVIR/RITONAVIR (PAXLOVID)TABLET: 3.0000 | ORAL_TABLET | Freq: Two times a day (BID) | ORAL | 0 refills | Status: AC

## 2022-11-04 MED ORDER — PROMETHAZINE-DM 6.25-15 MG/5ML PO SYRP: 5.0000 mL | ORAL_SOLUTION | Freq: Two times a day (BID) | ORAL | 0 refills | Status: DC | PRN

## 2022-11-04 MED ORDER — BENZONATATE 200 MG PO CAPS: 200.0000 mg | ORAL_CAPSULE | Freq: Three times a day (TID) | ORAL | 0 refills | Status: AC | PRN

## 2022-11-04 MED ORDER — AMOXICILLIN-POT CLAVULANATE 875-125 MG PO TABS: 1.0000 | ORAL_TABLET | Freq: Two times a day (BID) | ORAL | 0 refills | Status: DC

## 2022-11-04 NOTE — ED Provider Notes (Signed)
[facility]    CSN: [id number] Arrival date & time: 11/04/22  7829      History   Chief Complaint Chief Complaint  Patient presents with   Cough   Nasal Congestion    HPI Patricia Webb is a 52 y.o. female.   HPI   52 year old female presents with cough and congestion for 10 days.  Reports was evaluated at urgent care last week and negative testing; however, was treated with Zithromax.  Reports still having lingering cough and is supposed to have outpatient surgery today with GYN [facility]).  PMH significant for anxiety, GERD, and HTN.  Past Medical History:  Diagnosis Date   Anxiety    Concussion    GERD (gastroesophageal reflux disease)    History of tear of ACL (anterior cruciate ligament) 08/07/2015   Hypertension     Patient Active Problem List   Diagnosis Date Noted   Anxiety 12/30/2019   Fibrocystic breast 12/30/2019   GERD (gastroesophageal reflux disease) 12/30/2019   Low back pain 12/30/2019   Poor venous access 12/27/2018   Panic disorder (episodic paroxysmal anxiety) 09/09/2018   Ovarian cyst, right 06/02/2017    Past Surgical History:  Procedure Laterality Date   EYE SURGERY Bilateral    Lasik   KNEE SURGERY Left    microdiscectomy Bilateral    x 2   WISDOM TOOTH EXTRACTION Left    one upper and one lower left side    OB History     Gravida  0   Para  0   Term  0   Preterm  0   AB  0   Living  0      SAB  0   IAB  0   Ectopic  0   Multiple  0   Live Births  0            Home Medications    Prior to Admission medications   Medication Sig Start Date End Date Taking? Authorizing Provider  amoxicillin-clavulanate (AUGMENTIN) 875-125 MG tablet Take 1 tablet by mouth every 12 (twelve) hours. 11/04/22  Yes Eliezer Lofts, FNP  benzonatate (TESSALON) 200 MG capsule Take 1 capsule (200 mg total) by mouth 3 (three) times daily as needed for up to 7 days. 11/04/22 11/11/22 Yes Eliezer Lofts, FNP   promethazine-dextromethorphan (PROMETHAZINE-DM) 6.25-15 MG/5ML syrup Take 5 mLs by mouth 2 (two) times daily as needed for cough. 11/04/22  Yes Eliezer Lofts, FNP  acetaminophen (TYLENOL) 500 MG tablet Take 1,000 mg by mouth every 6 (six) hours as needed for moderate pain.    [provider]  Aspirin-Caffeine (BAYER BACK & BODY) 500-32.5 MG TABS Take 2 tablets by mouth daily as needed (pain).    [provider]  COLLAGEN PO Take 15 mLs by mouth daily.    [provider]  diphenhydrAMINE HCl, Sleep, (ZZZQUIL) 50 MG/30ML LIQD Take 50 mg by mouth at bedtime as needed (sleep).    [provider]  ELDERBERRY PO Take 2 capsules by mouth daily.    [provider]  GLUCOSAMINE-CHONDROITIN PO Take 30 mLs by mouth daily.    [provider]  ibuprofen (ADVIL) 200 MG tablet Take 600 mg by mouth every 6 (six) hours as needed for moderate pain.    [provider]  methocarbamol (ROBAXIN) 500 MG tablet Take 1 tablet (500 mg total) by mouth 3 (three) times daily as needed for muscle spasms. 05/30/22   Samuel Bouche, NP  Multiple Vitamin (MULTIVITAMIN  WITH MINERALS) TABS tablet Take 1 tablet by mouth daily.    [provider]  nirmatrelvir/ritonavir (PAXLOVID) 20 x 150 MG & 10 x 100MG  TABS Take 3 tablets by mouth 2 (two) times daily for 5 days. Patient GFR is >90. Take nirmatrelvir (150 mg) two tablets twice daily for 5 days and ritonavir (100 mg) one tablet twice daily for 5 days. 11/04/22 11/09/22 Yes 11/11/22, FNP  OVER THE COUNTER MEDICATION Take 4 tablets by mouth daily. Nutrafol supplement    [provider]  OVER THE COUNTER MEDICATION Take 2 tablets by mouth daily. NMN supplement    [provider]  propranolol ER (INDERAL LA) 60 MG 24 hr capsule TAKE 1 CAPSULE BY MOUTH EVERYDAY AT BEDTIME 07/09/22   07/11/22, NP  topiramate (TOPAMAX) 50 MG tablet Take 1 tablet (50 mg total) by mouth 2 (two) times daily. 08/18/22    08/20/22, NP  tranexamic acid (LYSTEDA) 650 MG TABS tablet Take 2 tablets (1,300 mg total) by mouth 3 (three) times daily as needed. Patient not taking: Reported on 10/29/2022 09/12/22   09/14/22, NP    Family History Family History  Problem Relation Age of Onset   Healthy Mother    Healthy Father    Diabetes Paternal Grandmother    Obesity Paternal Aunt     Social History Social History   Tobacco Use   Smoking status: Never   Smokeless tobacco: Never  Vaping Use   Vaping Use: Never used  Substance Use Topics   Alcohol use: Yes    Alcohol/week: 4.0 - 5.0 standard drinks of alcohol    Types: 4 - 5 Glasses of wine per week    Comment: Drinks on the weekend   Drug use: Never     Allergies   Patient has no known allergies.   Review of Systems Review of Systems  HENT:  Positive for congestion.   Respiratory:  Positive for cough.   All other systems reviewed and are negative.    Physical Exam Triage Vital Signs ED Triage Vitals  Enc Vitals Group     BP 11/04/22 0848 132/86     Pulse Rate 11/04/22 0848 (!) 114     Resp 11/04/22 0848 18     Temp 11/04/22 0848 98.7 F (37.1 C)     Temp Source 11/04/22 0848 Oral     SpO2 11/04/22 0848 98 %     Weight 11/04/22 0849 153 lb 14.1 oz (69.8 kg)     Height --      Head Circumference --      Peak Flow --      Pain Score 11/04/22 0848 0     Pain Loc --      Pain Edu? --      Excl. in GC? --    No data found.  Updated Vital Signs BP 132/86 (BP Location: Right Arm)   Pulse (!) 114   Temp 98.7 F (37.1 C) (Oral)   Resp 18   Wt 153 lb 14.1 oz (69.8 kg)   LMP 10/12/2022 (Approximate)   SpO2 98%   BMI 28.15 kg/m   Visual Acuity Right Eye Distance:   Left Eye Distance:   Bilateral Distance:    Right Eye Near:   Left Eye Near:    Bilateral Near:     Physical Exam Vitals and nursing note reviewed.  Constitutional:      General: She is not in acute distress.  Appearance: Normal appearance. She is  obese. She is ill-appearing.  HENT:     Head: Normocephalic and atraumatic.     Right Ear: Tympanic membrane, ear canal and external ear normal.     Left Ear: Ear canal and external ear normal.     Ears:     Comments: Left TM: Erythematous, bulging    Mouth/Throat:     Mouth: Mucous membranes are moist.     Pharynx: Oropharynx is clear.  Eyes:     Extraocular Movements: Extraocular movements intact.     Conjunctiva/sclera: Conjunctivae normal.     Pupils: Pupils are equal, round, and reactive to light.  Cardiovascular:     Rate and Rhythm: Regular rhythm. Tachycardia present.     Pulses: Normal pulses.     Heart sounds: No murmur heard. Pulmonary:     Effort: Pulmonary effort is normal.     Breath sounds: Normal breath sounds. No wheezing, rhonchi or rales.  Musculoskeletal:        General: Normal range of motion.     Cervical back: Normal range of motion and neck supple.  Skin:    General: Skin is warm and dry.  Neurological:     General: No focal deficit present.     Mental Status: She is alert and oriented to person, place, and time. Mental status is at baseline.      UC Treatments / Results  Labs (all labs ordered are listed, but only abnormal results are displayed) Labs Reviewed  POC SARS CORONAVIRUS 2 AG -  ED - Abnormal; Notable for the following components:      Result Value   SARS Coronavirus 2 Ag Positive (*)    All other components within normal limits  POCT INFLUENZA A/B    EKG   Radiology No results found.  Procedures Procedures (including critical care time)  Medications Ordered in UC Medications - No data to display  Initial Impression / Assessment and Plan / UC Course  I have reviewed the triage vital signs and the nursing notes.  Pertinent labs & imaging results that were available during my care of the patient were reviewed by me and considered in my medical decision making (see chart for details).     MDM: 1.  COVID-19-Rx'd Paxlovid;  2.  Cough-Rx'd Tessalon, promethazine DM; 3.  Left acute otitis media-Rx'd Augmentin. Advised patient to take medication (Paxlovid) as directed with food to completion.  Advised patient once complete with Paxlovid may start Augmentin advised please take medication with food to completion.  Advised may take Tessalon Perles daily or as needed for cough.  Advised may use promethazine DM for cough prior to sleep due to sedate of effects. Advised patient not to use cough medications together.  Encouraged increase daily water intake to 64 ounces per day while taking these medications self quarantine for 5 days. Advised if symptoms worsen and/or unresolved please follow-up with PCP or here for further evaluation.  Patient discharged home, hemodynamically stable. Final Clinical Impressions(s) / UC Diagnoses   Final diagnoses:  Cough, unspecified type  COVID-19  Acute left otitis media     Discharge Instructions      Advised patient to take medication (Paxlovid) as directed with food to completion.  Advised patient once complete with Paxlovid may start Augmentin advised please take medication with food to completion.  Advised may take Tessalon Perles daily or as needed for cough.  Advised may use promethazine DM for cough prior to sleep due to  sedate of effects. Advised patient not to use cough medications together.  Encouraged increase daily water intake to 64 ounces per day while taking these medications self quarantine for 5 days. Advised if symptoms worsen and/or unresolved please follow-up with PCP or here for further evaluation.     ED Prescriptions     Medication Sig Dispense Auth. Provider   promethazine-dextromethorphan (PROMETHAZINE-DM) 6.25-15 MG/5ML syrup Take 5 mLs by mouth 2 (two) times daily as needed for cough. 118 mL Trevor Iha, FNP   benzonatate (TESSALON) 200 MG capsule Take 1 capsule (200 mg total) by mouth 3 (three) times daily as needed for up to 7 days. 40 capsule Trevor Iha, FNP   amoxicillin-clavulanate (AUGMENTIN) 875-125 MG tablet Take 1 tablet by mouth every 12 (twelve) hours. 14 tablet Trevor Iha, FNP   nirmatrelvir/ritonavir (PAXLOVID) 20 x 150 MG & 10 x 100MG  TABS Take 3 tablets by mouth 2 (two) times daily for 5 days. Patient GFR is >90. Take nirmatrelvir (150 mg) two tablets twice daily for 5 days and ritonavir (100 mg) one tablet twice daily for 5 days. 30 tablet , FNP      PDMP not reviewed this encounter.   Trevor Iha, FNP 11/04/22 [phone]

## 2022-11-04 NOTE — Discharge Instructions (Addendum)
Advised patient to take medication (Paxlovid) as directed with food to completion.  Advised patient once complete with Paxlovid may start Augmentin advised please take medication with food to completion.  Advised may take Tessalon Perles daily or as needed for cough.  Advised may use promethazine DM for cough prior to sleep due to sedate of effects. Advised patient not to use cough medications together.  Encouraged increase daily water intake to 64 ounces per day while taking these medications self quarantine for 5 days. Advised if symptoms worsen and/or unresolved please follow-up with PCP or here for further evaluation.

## 2022-11-04 NOTE — ED Triage Notes (Signed)
Pt c/o cough and congestion x 10 days. Seen at UC last weekend, neg testing. Tx with zapak. Still having lingering cough. Is supposed to have surgery (a DNC) this afternoon. Tylenol and benzonatate prn.

## 2022-11-05 ENCOUNTER — Telehealth: Payer: Self-pay | Admitting: Emergency Medicine

## 2022-11-05 NOTE — Telephone Encounter (Signed)
Call by this RN to see how Leanette was today -pt asked if she could start Augmentin today or if she should wait until she is finished w/ paxlovid- RN reviewed chart & discharge meds w/ provider here today - okay to start Augmentin. Pt stated she feels a little bit better. No other questions or concerns

## 2022-11-17 ENCOUNTER — Encounter: Payer: Self-pay | Admitting: Medical-Surgical

## 2022-11-17 ENCOUNTER — Encounter: Payer: Self-pay | Admitting: Obstetrics and Gynecology

## 2022-11-17 DIAGNOSIS — N950 Postmenopausal bleeding: Secondary | ICD-10-CM

## 2022-11-17 MED ORDER — TRANEXAMIC ACID 650 MG PO TABS: 1300.0000 mg | ORAL_TABLET | Freq: Three times a day (TID) | ORAL | 0 refills | Status: DC | PRN

## 2022-11-17 MED ORDER — MEGESTROL ACETATE 40 MG PO TABS: 40.0000 mg | ORAL_TABLET | Freq: Two times a day (BID) | ORAL | 1 refills | Status: DC

## 2022-11-19 ENCOUNTER — Other Ambulatory Visit: Payer: Self-pay | Admitting: Medical-Surgical

## 2022-11-19 DIAGNOSIS — Z780 Asymptomatic menopausal state: Secondary | ICD-10-CM

## 2022-11-19 DIAGNOSIS — N939 Abnormal uterine and vaginal bleeding, unspecified: Secondary | ICD-10-CM

## 2022-12-08 ENCOUNTER — Other Ambulatory Visit: Payer: Self-pay | Admitting: Medical-Surgical

## 2022-12-08 DIAGNOSIS — R030 Elevated blood-pressure reading, without diagnosis of hypertension: Secondary | ICD-10-CM

## 2022-12-16 ENCOUNTER — Other Ambulatory Visit: Payer: Self-pay | Admitting: Obstetrics and Gynecology

## 2022-12-16 DIAGNOSIS — N950 Postmenopausal bleeding: Secondary | ICD-10-CM

## 2022-12-19 ENCOUNTER — Encounter (HOSPITAL_BASED_OUTPATIENT_CLINIC_OR_DEPARTMENT_OTHER): Payer: Self-pay | Admitting: Obstetrics and Gynecology

## 2022-12-19 NOTE — Progress Notes (Signed)
Spoke w/ via phone for pre-op interview--- pt Lab needs dos---- t&s, istat, ekg              Lab results------ no COVID test -----patient states asymptomatic no test needed Arrive at ------- 0715 on 12-23-2022 NPO after MN NO Solid Food.  Clear liquids from MN until--- 0615 Med rec completed Medications to take morning of surgery ----- prilosec Diabetic medication ----- n/a Patient instructed no nail polish to be worn day of surgery Patient instructed to bring photo id and insurance card day of surgery Patient aware to have Driver (ride ) / caregiver    for 24 hours after surgery -- mother, carol Patient Special Instructions ----- n/a Pre-Op special Istructions ----- n/a Patient verbalized understanding of instructions that were given at this phone interview. Patient denies shortness of breath, chest pain, fever, cough at this phone interview.

## 2022-12-23 ENCOUNTER — Encounter (HOSPITAL_BASED_OUTPATIENT_CLINIC_OR_DEPARTMENT_OTHER): Payer: Self-pay | Admitting: Obstetrics and Gynecology

## 2022-12-23 ENCOUNTER — Ambulatory Visit (HOSPITAL_BASED_OUTPATIENT_CLINIC_OR_DEPARTMENT_OTHER): Payer: 59 | Admitting: Anesthesiology

## 2022-12-23 ENCOUNTER — Encounter (HOSPITAL_BASED_OUTPATIENT_CLINIC_OR_DEPARTMENT_OTHER)
Admission: RE | Disposition: A | Discharge status: Home / Self Care | Payer: Self-pay | Source: Home / Self Care | Attending: Obstetrics and Gynecology

## 2022-12-23 ENCOUNTER — Ambulatory Visit (HOSPITAL_COMMUNITY)
Admission: RE | Admit: 2022-12-23 | Discharge: 2022-12-23 | Disposition: A | Discharge status: Home / Self Care | Payer: 59 | Attending: Obstetrics and Gynecology | Admitting: Obstetrics and Gynecology

## 2022-12-23 ENCOUNTER — Other Ambulatory Visit: Payer: Self-pay

## 2022-12-23 DIAGNOSIS — K219 Gastro-esophageal reflux disease without esophagitis: Secondary | ICD-10-CM | POA: Insufficient documentation

## 2022-12-23 DIAGNOSIS — N950 Postmenopausal bleeding: Secondary | ICD-10-CM | POA: Diagnosis present

## 2022-12-23 DIAGNOSIS — Z01818 Encounter for other preprocedural examination: Secondary | ICD-10-CM

## 2022-12-23 DIAGNOSIS — Z23 Encounter for immunization: Secondary | ICD-10-CM | POA: Diagnosis not present

## 2022-12-23 DIAGNOSIS — I10 Essential (primary) hypertension: Secondary | ICD-10-CM | POA: Diagnosis not present

## 2022-12-23 HISTORY — DX: Generalized anxiety disorder: F41.1

## 2022-12-23 HISTORY — DX: Personal history of other mental and behavioral disorders: Z86.59

## 2022-12-23 HISTORY — DX: Postmenopausal bleeding: N95.0

## 2022-12-23 HISTORY — PX: HYSTEROSCOPY WITH D & C: SHX1775

## 2022-12-23 HISTORY — DX: Presence of spectacles and contact lenses: Z97.3

## 2022-12-23 LAB — POCT I-STAT, CHEM 8
BUN: 21 mg/dL — ABNORMAL HIGH (ref 6–20)
Calcium, Ion: 1.14 mmol/L — ABNORMAL LOW (ref 1.15–1.40)
Chloride: 107 mmol/L (ref 98–111)
Creatinine, Ser: 0.7 mg/dL (ref 0.44–1.00)
Glucose, Bld: 95 mg/dL (ref 70–99)
HCT: 46 % (ref 36.0–46.0)
Hemoglobin: 15.6 g/dL — ABNORMAL HIGH (ref 12.0–15.0)
Potassium: 4.3 mmol/L (ref 3.5–5.1)
Sodium: 138 mmol/L (ref 135–145)
TCO2: 20 mmol/L — ABNORMAL LOW (ref 22–32)

## 2022-12-23 LAB — TYPE AND SCREEN
ABO/RH(D): O POS
Antibody Screen: NEGATIVE

## 2022-12-23 LAB — POCT PREGNANCY, URINE: Preg Test, Ur: NEGATIVE

## 2022-12-23 LAB — ABO/RH: ABO/RH(D): O POS

## 2022-12-23 SURGERY — DILATATION AND CURETTAGE /HYSTEROSCOPY
Anesthesia: General | Site: Uterus

## 2022-12-23 MED ORDER — LIDOCAINE HCL (PF) 2 % IJ SOLN
INTRAMUSCULAR | Status: AC
  Filled 2022-12-23: qty 5

## 2022-12-23 MED ORDER — DEXAMETHASONE SODIUM PHOSPHATE 10 MG/ML IJ SOLN
INTRAMUSCULAR | Status: AC
  Filled 2022-12-23: qty 1

## 2022-12-23 MED ORDER — FENTANYL CITRATE (PF) 100 MCG/2ML IJ SOLN
25.0000 ug | INTRAMUSCULAR | Status: DC | PRN
  Administered 2022-12-23 (×2): 25 ug via INTRAVENOUS
  Administered 2022-12-23: 50 ug via INTRAVENOUS

## 2022-12-23 MED ORDER — ONDANSETRON HCL 4 MG/2ML IJ SOLN
INTRAMUSCULAR | Status: AC
  Filled 2022-12-23: qty 2

## 2022-12-23 MED ORDER — KETOROLAC TROMETHAMINE 30 MG/ML IJ SOLN
INTRAMUSCULAR | Status: AC
  Filled 2022-12-23: qty 1

## 2022-12-23 MED ORDER — SODIUM CHLORIDE 0.9 % IR SOLN
Status: DC | PRN
  Administered 2022-12-23: 3000 mL

## 2022-12-23 MED ORDER — ACETAMINOPHEN 500 MG PO TABS
ORAL_TABLET | ORAL | Status: AC
  Filled 2022-12-23: qty 2

## 2022-12-23 MED ORDER — TETANUS-DIPHTH-ACELL PERTUSSIS 5-2.5-18.5 LF-MCG/0.5 IM SUSY
0.5000 mL | PREFILLED_SYRINGE | Freq: Once | INTRAMUSCULAR | Status: AC
  Administered 2022-12-23: 0.5 mL via INTRAMUSCULAR
  Filled 2022-12-23: qty 0.5

## 2022-12-23 MED ORDER — LACTATED RINGERS IV SOLN: INTRAVENOUS | Status: DC

## 2022-12-23 MED ORDER — FENTANYL CITRATE (PF) 100 MCG/2ML IJ SOLN
INTRAMUSCULAR | Status: AC
  Filled 2022-12-23: qty 2

## 2022-12-23 MED ORDER — FENTANYL CITRATE (PF) 100 MCG/2ML IJ SOLN
INTRAMUSCULAR | Status: DC | PRN
  Administered 2022-12-23 (×2): 50 ug via INTRAVENOUS

## 2022-12-23 MED ORDER — KETOROLAC TROMETHAMINE 30 MG/ML IJ SOLN
INTRAMUSCULAR | Status: DC | PRN
  Administered 2022-12-23: 30 mg via INTRAVENOUS

## 2022-12-23 MED ORDER — LIDOCAINE 2% (20 MG/ML) 5 ML SYRINGE
INTRAMUSCULAR | Status: DC | PRN
  Administered 2022-12-23: 60 mg via INTRAVENOUS

## 2022-12-23 MED ORDER — OXYCODONE HCL 5 MG PO TABS
ORAL_TABLET | ORAL | Status: AC
  Filled 2022-12-23: qty 1

## 2022-12-23 MED ORDER — PROPOFOL 10 MG/ML IV BOLUS
INTRAVENOUS | Status: DC | PRN
  Administered 2022-12-23: 200 mg via INTRAVENOUS

## 2022-12-23 MED ORDER — ONDANSETRON HCL 4 MG/2ML IJ SOLN
INTRAMUSCULAR | Status: DC | PRN
  Administered 2022-12-23: 4 mg via INTRAVENOUS

## 2022-12-23 MED ORDER — OXYCODONE HCL 5 MG PO TABS
5.0000 mg | ORAL_TABLET | Freq: Once | ORAL | Status: AC | PRN
  Administered 2022-12-23: 5 mg via ORAL

## 2022-12-23 MED ORDER — DIPHENHYDRAMINE HCL 50 MG/ML IJ SOLN
12.5000 mg | Freq: Once | INTRAMUSCULAR | Status: AC
  Administered 2022-12-23: 12.5 mg via INTRAVENOUS

## 2022-12-23 MED ORDER — MIDAZOLAM HCL 2 MG/2ML IJ SOLN
INTRAMUSCULAR | Status: DC | PRN
  Administered 2022-12-23: 2 mg via INTRAVENOUS

## 2022-12-23 MED ORDER — PROPOFOL 10 MG/ML IV BOLUS
INTRAVENOUS | Status: AC
  Filled 2022-12-23: qty 20

## 2022-12-23 MED ORDER — DIPHENHYDRAMINE HCL 50 MG/ML IJ SOLN
INTRAMUSCULAR | Status: AC
  Filled 2022-12-23: qty 1

## 2022-12-23 MED ORDER — ACETAMINOPHEN 500 MG PO TABS
1000.0000 mg | ORAL_TABLET | Freq: Once | ORAL | Status: AC
  Administered 2022-12-23: 1000 mg via ORAL

## 2022-12-23 MED ORDER — MIDAZOLAM HCL 2 MG/2ML IJ SOLN
INTRAMUSCULAR | Status: AC
  Filled 2022-12-23: qty 2

## 2022-12-23 MED ORDER — DEXAMETHASONE SODIUM PHOSPHATE 10 MG/ML IJ SOLN
INTRAMUSCULAR | Status: DC | PRN
  Administered 2022-12-23 (×2): 5 mg via INTRAVENOUS

## 2022-12-23 SURGICAL SUPPLY — 19 items
CATH ROBINSON RED A/P 16FR (CATHETERS) ×1 IMPLANT
CURETTE PIPELLE ENDOMTRL SUCTN (MISCELLANEOUS) IMPLANT
DEVICE MYOSURE LITE (MISCELLANEOUS) IMPLANT
DEVICE MYOSURE REACH (MISCELLANEOUS) IMPLANT
DILATOR CANAL MILEX (MISCELLANEOUS) IMPLANT
GLOVE BIO SURGEON STRL SZ7 (GLOVE) ×1 IMPLANT
GLOVE BIOGEL PI IND STRL 7.0 (GLOVE) ×1 IMPLANT
GOWN STRL REUS W/ TWL LRG LVL3 (GOWN DISPOSABLE) ×2 IMPLANT
GOWN STRL REUS W/TWL LRG LVL3 (GOWN DISPOSABLE) ×2
KIT PROCEDURE FLUENT (KITS) ×1 IMPLANT
KIT TURNOVER CYSTO (KITS) ×1 IMPLANT
MYOSURE XL FIBROID (MISCELLANEOUS)
PACK VAGINAL MINOR WOMEN LF (CUSTOM PROCEDURE TRAY) ×1 IMPLANT
PAD OB MATERNITY 4.3X12.25 (PERSONAL CARE ITEMS) ×1 IMPLANT
PIPELLE ENDOMETRIAL SUCTION CU (MISCELLANEOUS) ×1
SEAL ROD LENS SCOPE MYOSURE (ABLATOR) ×1 IMPLANT
SLEEVE SCD COMPRESS KNEE MED (STOCKING) ×1 IMPLANT
SYSTEM TISS REMOVAL MYOSURE XL (MISCELLANEOUS) IMPLANT
TOWEL OR 17X24 6PK STRL BLUE (TOWEL DISPOSABLE) ×1 IMPLANT

## 2022-12-23 NOTE — Discharge Instructions (Signed)
  Post Anesthesia Home Care Instructions  Activity: Get plenty of rest for the remainder of the day. A responsible individual must stay with you for 24 hours following the procedure.  For the next 24 hours, DO NOT: -Drive a car -Paediatric nurse -Drink alcoholic beverages -Take any medication unless instructed by your physician -Make any legal decisions or sign important papers.  Meals: Start with liquid foods such as gelatin or soup. Progress to regular foods as tolerated. Avoid greasy, spicy, heavy foods. If nausea and/or vomiting occur, drink only clear liquids until the nausea and/or vomiting subsides. Call your physician if vomiting continues.  Special Instructions/Symptoms: Your throat may feel dry or sore from the anesthesia or the breathing tube placed in your throat during surgery. If this causes discomfort, gargle with warm salt water. The discomfort should disappear within 24 hours.  If you had a scopolamine patch placed behind your ear for the management of post- operative nausea and/or vomiting:  1. The medication in the patch is effective for 72 hours, after which it should be removed.  Wrap patch in a tissue and discard in the trash. Wash hands thoroughly with soap and water. 2. You may remove the patch earlier than 72 hours if you experience unpleasant side effects which may include dry mouth, dizziness or visual disturbances. 3. Avoid touching the patch. Wash your hands with soap and water after contact with the patch.    Oxycodone 5 mg given at 11:45 am.

## 2022-12-23 NOTE — Anesthesia Postprocedure Evaluation (Signed)
Anesthesia Post Note  Patient: [name]  Procedure(s) Performed: DILATATION AND CURETTAGE /HYSTEROSCOPY AND TDAP UNDER ANESTHESIA (Uterus)     Patient location during evaluation: PACU Anesthesia Type: General Level of consciousness: awake and alert Pain management: pain level controlled Vital Signs Assessment: post-procedure vital signs reviewed and stable Respiratory status: spontaneous breathing, nonlabored ventilation, respiratory function stable and patient connected to nasal cannula oxygen Cardiovascular status: blood pressure returned to baseline and stable Postop Assessment: no apparent nausea or vomiting Anesthetic complications: no  No notable events documented.  Last Vitals:  Vitals:   12/23/22 1130 12/23/22 1145  BP: (!) 170/82 (!) 154/97  Pulse: (!) 50 68  Resp: 19 20  Temp:    SpO2: 100% 100%    Last Pain:  Vitals:   12/23/22 1145  TempSrc:   PainSc: 5                  Annaelle Kasel L Ammi Hutt

## 2022-12-23 NOTE — Anesthesia Procedure Notes (Signed)
Procedure Name: LMA Insertion Date/Time: 12/23/2022 7:50 PM  Performed by: Suan Halter, CRNAPre-anesthesia Checklist: Patient identified, Emergency Drugs available, Suction available and Patient being monitored Patient Re-evaluated:Patient Re-evaluated prior to induction Oxygen Delivery Method: Circle system utilized Preoxygenation: Pre-oxygenation with 100% oxygen Induction Type: IV induction Ventilation: Mask ventilation without difficulty LMA: LMA inserted LMA Size: 4.0 Number of attempts: 1 Airway Equipment and Method: Bite block Placement Confirmation: positive ETCO2 Tube secured with: Tape Dental Injury: Teeth and Oropharynx as per pre-operative assessment

## 2022-12-23 NOTE — Op Note (Signed)
12/23/22 10:22 AM  Preoperative Diagnosis: Postmenopausal bleeding Postoperative Diagnosis: Same Procedure: Hysteroscopy D&C  Surgeon: Dr. Gale Journey Assistant: None EBL 5 cc IVF: 300 cc  UOP: Not collected Deficit: 100cc  Specimens: Endometrial curetting  Findings: NEFG. Uterus small, mobile, sounded to 6.5cm. Cervix visually normal. Normal appearing endometrium & endometrial cavity. Scant tissue with D&C  Description of the procedure: Preop antibiotics not indicated. Informed consent reviewed and signed. Pt given opportunity to ask questions.   Pt prepped and draped in the dorsal lithotomy fashion after LMA anesthesia found to be adequate. Timeout performed.   Open sided speculum placed into the patient's vagina. Single tooth tenaculum applied to the 12 o'clock position of the cervix. Cervix was initially difficult to dilate, so hysteroscope inserted for hydrodissection/dilation. Hysteroscope removed since initially unable to enter cavity. Cervix progressively dilated to 21Fr without issue. 30 degree hysteroscope was inserted into the cavity with the aforementioned findings noted. Uterus sounded to 6.5cm with hysteroscope. Scope removed. EMC performed. Due to scant tissue return, an endometrial biopsy pipelle was passed twice to collect any additional tissue fragments. Repeat examination with hysteroscope confirmed tissue removal and cavity was intact.   Hemostatic at the end of the procedure. Procedure completed. All instruments removed. Counts correct x2.  Pt taken to recovery room in stable condition.  Gale Journey, MD [place], [facility] for [employer], [place]

## 2022-12-23 NOTE — Transfer of Care (Signed)
Immediate Anesthesia Transfer of Care Note  Patient: Patricia Webb  Procedure(s) Performed: Procedure(s) (LRB): DILATATION AND CURETTAGE /HYSTEROSCOPY AND TDAP UNDER ANESTHESIA (N/A)  Patient Location: PACU  Anesthesia Type: General  Level of Consciousness: awake, oriented, sedated and patient cooperative  Airway & Oxygen Therapy: Patient Spontanous Breathing and Patient connected to face mask oxygen  Post-op Assessment: Report given to PACU RN and Post -op Vital signs reviewed and stable  Post vital signs: Reviewed and stable  Complications: No apparent anesthesia complications Last Vitals:  Vitals Value Taken Time  BP 166/87 12/23/22 1027  Temp 36.6 C 12/23/22 1027  Pulse 72 12/23/22 1029  Resp 16 12/23/22 1029  SpO2 99 % 12/23/22 1029  Vitals shown include unvalidated device data.  Last Pain:  Vitals:   12/23/22 0727  TempSrc: Oral  PainSc: 0-No pain      Patients Stated Pain Goal: 3 ([id number])  Complications: No notable events documented.

## 2022-12-23 NOTE — Anesthesia Preprocedure Evaluation (Addendum)
Anesthesia Evaluation  Patient identified by MRN, date of birth, ID band Patient awake    Reviewed: Allergy & Precautions, NPO status , Patient's Chart, lab work & pertinent test results, reviewed documented beta blocker date and time   Airway Mallampati: II  TM Distance: >3 FB Neck ROM: Full    Dental no notable dental hx.    Pulmonary neg pulmonary ROS   Pulmonary exam normal breath sounds clear to auscultation       Cardiovascular hypertension, Pt. on home beta blockers and Pt. on medications Normal cardiovascular exam Rhythm:Regular Rate:Normal     Neuro/Psych  PSYCHIATRIC DISORDERS Anxiety     negative neurological ROS     GI/Hepatic Neg liver ROS,GERD  ,,  Endo/Other  negative endocrine ROS    Renal/GU negative Renal ROS  negative genitourinary   Musculoskeletal negative musculoskeletal ROS (+)    Abdominal   Peds  Hematology negative hematology ROS (+)   Anesthesia Other Findings   Reproductive/Obstetrics                             Anesthesia Physical Anesthesia Plan  ASA: 2  Anesthesia Plan: General   Post-op Pain Management: Tylenol PO (pre-op)*   Induction: Intravenous  PONV Risk Score and Plan: 3 and Ondansetron, Dexamethasone and Midazolam  Airway Management Planned: LMA  Additional Equipment:   Intra-op Plan:   Post-operative Plan: Extubation in OR  Informed Consent: I have reviewed the patients History and Physical, chart, labs and discussed the procedure including the risks, benefits and alternatives for the proposed anesthesia with the patient or authorized representative who has indicated his/her understanding and acceptance.     Dental advisory given  Plan Discussed with: CRNA  Anesthesia Plan Comments:        Anesthesia Quick Evaluation

## 2022-12-23 NOTE — H&P (Addendum)
   PREOPERATIVE H&P  Subjective:  Patricia Webb is a 52 y.o. postmenopausal G1P0 with presenting for hysteroscopy D&C for postmenopausal bleeding  Attempted biopsy in office for PMB without success. Patient presents today for hsc D&C. She has no complaints. Desires tdap while she is under anesthesia due to needle phobia  PMH: anxiety, GERD, HTN PSH: knee, back, dental Meds: propranolol, robaxin/ibuprofen/tylenol prn, supplements All: NKDA OB: G1 SAB Soc: denies t/e/d  Objective:   Vitals:   12/19/22 1052 12/23/22 0727  BP:  139/86  Pulse:  64  Resp:  17  Temp:  97.6 F (36.4 C)  TempSrc:  Oral  SpO2:  100%  Weight: 68 kg 68 kg  Height: 5' 2"$  (1.575 m) 5' 2"$  (1.575 m)    General:  Alert, oriented and cooperative. Patient is in no acute distress.  Skin: Skin is warm and dry. No rash noted.   Cardiovascular: Normal heart rate noted  Respiratory: Normal respiratory effort, no problems with respiration noted  Abdomen: Soft, non-tender, non-distended     Assessment and Plan:  Patricia Webb is a 52 y.o. with postmenopausal bleeding presenting for hsc D&C  Discussed recommendation for hsc D&C. Reviewed risks include but are not limited to pain infection bleeding injury to uterus/bowel/blood vessels need for additional procedures, insufficient sampling, fluid overload, and medical complications of surgery/anesthesia. She is accepting of these risks. Consents signed.   To OR. Pre op abx not indicated. Anticipate discharge home from PACU Will administer tdap in [place], MD

## 2022-12-24 ENCOUNTER — Encounter (HOSPITAL_BASED_OUTPATIENT_CLINIC_OR_DEPARTMENT_OTHER): Payer: Self-pay | Admitting: Obstetrics and Gynecology

## 2022-12-25 ENCOUNTER — Telehealth: Payer: Self-pay | Admitting: *Deleted

## 2022-12-25 LAB — SURGICAL PATHOLOGY

## 2022-12-25 NOTE — Telephone Encounter (Signed)
Left patient a message to call the office to schedule 4 week Post Op with Dr. Mikel Cella.

## 2023-02-09 LAB — HM MAMMOGRAPHY

## 2023-02-11 ENCOUNTER — Ambulatory Visit (AMBULATORY_SURGERY_CENTER): Payer: 59 | Admitting: *Deleted

## 2023-02-11 ENCOUNTER — Encounter: Payer: Self-pay | Admitting: Internal Medicine

## 2023-02-11 VITALS — Ht 62.0 in | Wt 148.0 lb

## 2023-02-11 DIAGNOSIS — Z1211 Encounter for screening for malignant neoplasm of colon: Secondary | ICD-10-CM

## 2023-02-11 MED ORDER — NA SULFATE-K SULFATE-MG SULF 17.5-3.13-1.6 GM/177ML PO SOLN: 1.0000 | Freq: Once | ORAL | Status: AC

## 2023-02-11 MED ORDER — NA SULFATE-K SULFATE-MG SULF 17.5-3.13-1.6 GM/177ML PO SOLN: 1.0000 | Freq: Once | ORAL | Status: DC

## 2023-02-11 MED ORDER — NA SULFATE-K SULFATE-MG SULF 17.5-3.13-1.6 GM/177ML PO SOLN: 1.0000 | Freq: Once | ORAL | 0 refills | Status: AC

## 2023-02-11 NOTE — Progress Notes (Signed)
Pt's pre-visit is done over the phone and all paperwork (prep instructions) sent to patient. Pt's name and DOB verified at the beginning of the pre-visit. Pt denies any difficulty with ambulating.  No egg or soy allergy known to patient  No issues known to pt with past sedation with any surgeries or procedures Pt denies having issues being intubated Pt has no issues moving head neck or swallowing No FH of Malignant Hyperthermia Pt is not on diet pills Pt is not on home 02  Pt is not on blood thinners  Pt denies issues with constipation  Pt is not on dialysis Pt denies any upcoming cardiac testing Pt encouraged to use to use Singlecare or Goodrx to reduce cost  Pt given name of prep to look up coupon and print.  Patient's chart reviewed by Cathlyn Parsons CNRA prior to pre-visit and patient appropriate for the [facility].  Pre-visit completed and red dot placed by patient's name on their procedure day (on provider's schedule).  . Visit by phone Pt states  weight is 148 lb Phentermine Pt last time took was 4/13 in am. Instructed pt to not take from today on. Pt states she will not take it. Pt went to my chart and was able to follow along with RN during instructions. States she will review the instructions again  but currently states she understands all her instructions. Pt given m;ain office # to call for any questions or concerns.

## 2023-02-11 NOTE — Addendum Note (Signed)
Addended by: Danton Sewer on: 02/11/2023 05:03 PM   Modules accepted: Orders

## 2023-02-12 ENCOUNTER — Encounter: Payer: Self-pay | Admitting: Medical-Surgical

## 2023-02-17 ENCOUNTER — Encounter: Payer: Self-pay | Admitting: Internal Medicine

## 2023-02-17 ENCOUNTER — Ambulatory Visit (AMBULATORY_SURGERY_CENTER): Payer: 59 | Admitting: Internal Medicine

## 2023-02-17 VITALS — BP 118/55 | HR 65 | Temp 98.2°F | Resp 12 | Ht 62.0 in | Wt 148.0 lb

## 2023-02-17 DIAGNOSIS — K6389 Other specified diseases of intestine: Secondary | ICD-10-CM | POA: Diagnosis not present

## 2023-02-17 DIAGNOSIS — Z1211 Encounter for screening for malignant neoplasm of colon: Secondary | ICD-10-CM

## 2023-02-17 DIAGNOSIS — K639 Disease of intestine, unspecified: Secondary | ICD-10-CM

## 2023-02-17 DIAGNOSIS — D120 Benign neoplasm of cecum: Secondary | ICD-10-CM

## 2023-02-17 MED ORDER — SODIUM CHLORIDE 0.9 % IV SOLN: 500.0000 mL | Freq: Once | INTRAVENOUS | Status: DC

## 2023-02-17 NOTE — Progress Notes (Signed)
HISTORY OF PRESENT ILLNESS:  Patricia Webb is a 52 y.o. female   REVIEW OF SYSTEMS:  All non-GI ROS negative except for  Past Medical History:  Diagnosis Date   GAD (generalized anxiety disorder)    GERD (gastroesophageal reflux disease)    History of concussion 05/2021   evaluated by neurologist--- dr Shela Commons. Patricia Webb (lov in epic 07-25-2021  stated pt fell on vacation LOC had headaches for a week, post concussion syndrome with no memory of accident per pt)   History of panic disorder    Hypertension    PMB (postmenopausal bleeding)    Wears contact lenses     Past Surgical History:  Procedure Laterality Date   HYSTEROSCOPY WITH D & C N/A 12/23/2022   Procedure: DILATATION AND CURETTAGE /HYSTEROSCOPY AND TDAP UNDER ANESTHESIA;  Surgeon: Patricia Pall, MD;  Location: [facility];  Service: Gynecology;  Laterality: N/A;   KNEE ARTHROSCOPY W/ ACL RECONSTRUCTION Left 11/14/2015   in [place], [place]   LUMBAR MICRODISCECTOMY     03/ 2010  and 03/ 2011  both L4--5   WISDOM TOOTH EXTRACTION Left    one upper and one lower left side    Social History Patricia Webb  reports that she has never smoked. She has never used smokeless tobacco. She reports current alcohol use of about 4.0 - 5.0 standard drinks of alcohol per week. She reports that she does not use drugs.  family history includes Colon polyps in her father; Diabetes in her paternal grandmother; Healthy in her father and mother; Obesity in her paternal aunt.  No Known Allergies     PHYSICAL EXAMINATION: Vital signs: BP 135/80   Pulse 61   Temp 98.2 F (36.8 C)   Resp 10   Ht  (1.575 m)   Wt 148 lb (67.1 kg)   LMP 08/10/2021 (Approximate)   SpO2 99%   BMI 27.07 kg/m  General: Well-developed, well-nourished, no acute distress HEENT: Sclerae are anicteric, conjunctiva pink. Oral mucosa intact Lungs: Clear Heart: Regular Abdomen: soft, nontender, nondistended, no obvious ascites, no peritoneal  signs, normal bowel sounds. No organomegaly. Extremities: No edema Psychiatric: alert and oriented x3. Cooperative     ASSESSMENT:   Colon cancer screening  PLAN:   Screening colonoscopy

## 2023-02-17 NOTE — Patient Instructions (Signed)
YOU HAD AN ENDOSCOPIC PROCEDURE TODAY AT THE [facility]:   Refer to the procedure report that was given to you for any specific questions about what was found during the examination.  If the procedure report does not answer your questions, please call your gastroenterologist to clarify.  If you requested that your care partner not be given the details of your procedure findings, then the procedure report has been included in a sealed envelope for you to review at your convenience later.  **Handouts given on polyps and diverticulosis**  YOU SHOULD EXPECT: Some feelings of bloating in the abdomen. Passage of more gas than usual.  Walking can help get rid of the air that was put into your GI tract during the procedure and reduce the bloating. If you had a lower endoscopy (such as a colonoscopy or flexible sigmoidoscopy) you may notice spotting of blood in your stool or on the toilet paper. If you underwent a bowel prep for your procedure, you may not have a normal bowel movement for a few days.  Please Note:  You might notice some irritation and congestion in your nose or some drainage.  This is from the oxygen used during your procedure.  There is no need for concern and it should clear up in a day or so.  SYMPTOMS TO REPORT IMMEDIATELY:  Following lower endoscopy (colonoscopy or flexible sigmoidoscopy):  Excessive amounts of blood in the stool  Significant tenderness or worsening of abdominal pains  Swelling of the abdomen that is new, acute  Fever of 100F or higher  For urgent or emergent issues, a gastroenterologist can be reached at any hour by calling [phone]. Do not use MyChart messaging for urgent concerns.    DIET:  We do recommend a small meal at first, but then you may proceed to your regular diet.  Drink plenty of fluids but you should avoid alcoholic beverages for 24 hours.  ACTIVITY:  You should plan to take it easy for the rest of today and you should NOT DRIVE  or use heavy machinery until tomorrow (because of the sedation medicines used during the test).    FOLLOW UP: Our staff will call the number listed on your records the next business day following your procedure.  We will call around 7:15- 8:00 am to check on you and address any questions or concerns that you may have regarding the information given to you following your procedure. If we do not reach you, we will leave a message.     If any biopsies were taken you will be contacted by phone or by letter within the next 1-3 weeks.  Please call us at [phone] if you have not heard about the biopsies in 3 weeks.    SIGNATURES/CONFIDENTIALITY: You and/or your care partner have signed paperwork which will be entered into your electronic medical record.  These signatures attest to the fact that that the information above on your After Visit Summary has been reviewed and is understood.  Full responsibility of the confidentiality of this discharge information lies with you and/or your care-partner. 

## 2023-02-17 NOTE — Op Note (Signed)
[facility] Patient Name: Patricia Webb Procedure Date: 02/17/2023 3:45 PM MRN: [id number] Endoscopist: Wilhemina Bonito. Marina Goodell , MD, [id number] Age: 52 Referring MD:  Date of Birth: [birth year] Gender: Female Account #: [id number] Procedure:                Colonoscopy with cold snare polypectomy x 1;                            biopsies Indications:              Screening for colorectal malignant neoplasm Medicines:                Monitored Anesthesia Care Procedure:                Pre-Anesthesia Assessment:                           - Prior to the procedure, a History and Physical                            was performed, and patient medications and                            allergies were reviewed. The patient's tolerance of                            previous anesthesia was also reviewed. The risks                            and benefits of the procedure and the sedation                            options and risks were discussed with the patient.                            All questions were answered, and informed consent                            was obtained. Prior Anticoagulants: The patient has                            taken no anticoagulant or antiplatelet agents. ASA                            Grade Assessment: II - A patient with mild systemic                            disease. After reviewing the risks and benefits,                            the patient was deemed in satisfactory condition to                            undergo the procedure.  After obtaining informed consent, the colonoscope                            was passed under direct vision. Throughout the                            procedure, the patient's blood pressure, pulse, and                            oxygen saturations were monitored continuously. The                            Olympus CF-HQ190L [id number]) Colonoscope was                            introduced through the  anus and advanced to the the                            cecum, identified by appendiceal orifice and                            ileocecal valve. The ileocecal valve, appendiceal                            orifice, and rectum were photographed. The quality                            of the bowel preparation was excellent. The                            colonoscopy was performed without difficulty. The                            patient tolerated the procedure well. The bowel                            preparation used was SUPREP via split dose                            instruction. Scope In: 4:07:20 PM Scope Out: 4:21:01 PM Scope Withdrawal Time: 0 hours 9 minutes 14 seconds  Total Procedure Duration: 0 hours 13 minutes 41 seconds  Findings:                 A 5 mm polyp was found in the cecum. The polyp was                            removed with a cold snare. Resection and retrieval                            were complete.                           Multiple small-mouthed diverticula were found in  the sigmoid colon.                           There was nonspecific submucosal hyperemia in the                            rectum. Most likely prep related changes. Biopsies                            taken. The exam was otherwise without abnormality                            on direct and retroflexion views. Complications:            No immediate complications. Estimated blood loss:                            None. Estimated Blood Loss:     Estimated blood loss: none. Impression:               - One 5 mm polyp in the cecum, removed with a cold                            snare. Resected and retrieved.                           - Diverticulosis in the sigmoid colon. Mild                            submucosal hyperemia of the rectum. Biopsied                           - The examination was otherwise normal on direct                            and retroflexion  views. Recommendation:           - Repeat colonoscopy in 7-10 years for surveillance.                           - Patient has a contact number available for                            emergencies. The signs and symptoms of potential                            delayed complications were discussed with the                            patient. Return to normal activities tomorrow.                            Written discharge instructions were provided to the                            patient.                           -  Resume previous diet.                           - Continue present medications.                           - Await pathology results. Wilhemina Bonito. Marina Goodell, MD 02/17/2023 4:30:05 PM This report has been signed electronically.

## 2023-02-17 NOTE — Progress Notes (Signed)
Vss nad trans to pacu 

## 2023-02-17 NOTE — Progress Notes (Signed)
Called to room to assist during endoscopic procedure.  Patient ID and intended procedure confirmed with present staff. Received instructions for my participation in the procedure from the performing physician.  

## 2023-02-17 NOTE — Progress Notes (Signed)
Pt's states no medical or surgical changes since previsit or office visit. 

## 2023-02-18 ENCOUNTER — Telehealth: Payer: Self-pay

## 2023-02-18 NOTE — Telephone Encounter (Signed)
Left HIPAA compliant voicemail. 

## 2023-03-09 ENCOUNTER — Encounter: Payer: Self-pay | Admitting: Internal Medicine

## 2023-04-08 DIAGNOSIS — M1612 Unilateral primary osteoarthritis, left hip: Secondary | ICD-10-CM | POA: Insufficient documentation

## 2023-06-01 ENCOUNTER — Ambulatory Visit (INDEPENDENT_AMBULATORY_CARE_PROVIDER_SITE_OTHER): Payer: 59 | Admitting: Medical-Surgical

## 2023-06-01 ENCOUNTER — Encounter: Payer: Self-pay | Admitting: Medical-Surgical

## 2023-06-01 DIAGNOSIS — R030 Elevated blood-pressure reading, without diagnosis of hypertension: Secondary | ICD-10-CM

## 2023-06-01 DIAGNOSIS — Z0000 Encounter for general adult medical examination without abnormal findings: Secondary | ICD-10-CM | POA: Diagnosis not present

## 2023-06-01 DIAGNOSIS — Z13220 Encounter for screening for lipoid disorders: Secondary | ICD-10-CM

## 2023-06-01 MED ORDER — PROPRANOLOL HCL ER 60 MG PO CP24: ORAL_CAPSULE | ORAL | 3 refills | Status: DC

## 2023-06-01 NOTE — Progress Notes (Signed)
Complete physical exam  Patient: Patricia Webb   DOB: 1971-10-22   52 y.o. Female  MRN: [id number]  Subjective:    Chief Complaint  Patient presents with   Annual Exam    Patricia Webb is a 52 y.o. female who presents today for a complete physical exam. She reports consuming a general diet. Exercise is limited by orthopedic condition(s): left hip OA. She generally feels well. She reports sleeping fairly well. She does not have additional problems to discuss today.    Most recent fall risk assessment:    05/30/2022    2:01 PM  Fall Risk   Falls in the past year? 0  Number falls in past yr: 0  Injury with Fall? 0  Risk for fall due to : No Fall Risks  Follow up Falls evaluation completed     Most recent depression screenings:    05/30/2022    2:02 PM 04/18/2021    2:07 PM  PHQ 2/9 Scores  PHQ - 2 Score 2 0  PHQ- 9 Score 10     Vision:Within last year, Dental: No current dental problems and Receives regular dental care, and STD: The patient reports a past history of: gonorrhea    Patient Care Team: Christen Butter, NP as PCP - General (Nurse Practitioner)   Outpatient Medications Prior to Visit  Medication Sig   acetaminophen (TYLENOL) 500 MG tablet Take 1,000 mg by mouth every 6 (six) hours as needed for moderate pain.   Aspirin-Caffeine (BAYER BACK & BODY) 500-32.5 MG TABS Take 2 tablets by mouth daily as needed (pain).   COLLAGEN PO Take 15 mLs by mouth daily.   diphenhydrAMINE HCl, Sleep, (ZZZQUIL) 50 MG/30ML LIQD Take 50 mg by mouth at bedtime as needed (sleep).   GLUCOSAMINE-CHONDROITIN PO Take 30 mLs by mouth daily.   Multiple Vitamin (MULTIVITAMIN WITH MINERALS) TABS tablet Take 1 tablet by mouth daily.   omeprazole (PRILOSEC) 20 MG capsule Take 20 mg by mouth as needed.   OVER THE COUNTER MEDICATION Take 4 tablets by mouth daily. Nutrafol supplement   TRETINOIN EX Apply topically 3 (three) times a week.   [DISCONTINUED] ibuprofen (ADVIL) 200 MG tablet Take  600 mg by mouth every 6 (six) hours as needed for moderate pain. (Patient not taking: Reported on 02/11/2023)   [DISCONTINUED] methocarbamol (ROBAXIN) 500 MG tablet Take 1 tablet (500 mg total) by mouth 3 (three) times daily as needed for muscle spasms. (Patient not taking: Reported on 02/11/2023)   [DISCONTINUED] phentermine 15 MG capsule    [DISCONTINUED] propranolol ER (INDERAL LA) 60 MG 24 hr capsule TAKE 1 CAPSULE BY MOUTH AT  BEDTIME (Patient taking differently: Take 60 mg by mouth at bedtime. TAKE 1 CAPSULE BY MOUTH AT  BEDTIME)   [DISCONTINUED] topiramate (TOPAMAX) 50 MG tablet Take 1 tablet (50 mg total) by mouth 2 (two) times daily. (Patient taking differently: Take 50 mg by mouth 2 (two) times daily.)   No facility-administered medications prior to visit.    Review of Systems  Constitutional:  Positive for malaise/fatigue. Negative for chills, fever and weight loss.  HENT:  Negative for congestion, ear pain, hearing loss, sinus pain and sore throat.   Eyes:  Negative for blurred vision, photophobia and pain.  Respiratory:  Negative for cough, shortness of breath and wheezing.   Cardiovascular:  Negative for chest pain, palpitations and leg swelling.  Gastrointestinal:  Positive for heartburn. Negative for abdominal pain, constipation, diarrhea, nausea and vomiting.  Genitourinary:  Negative for  dysuria, frequency and urgency.  Musculoskeletal:  Positive for back pain. Negative for falls and neck pain.  Skin:  Negative for itching and rash.  Neurological:  Negative for dizziness, weakness and headaches.  Endo/Heme/Allergies:  Negative for polydipsia. Does not bruise/bleed easily.  Psychiatric/Behavioral:  Negative for depression, substance abuse and suicidal ideas. The patient is not nervous/anxious and does not have insomnia.      Objective:    LMP 08/10/2021 (Approximate)    Physical Exam Vitals reviewed.  Constitutional:      General: She is not in acute distress.     Appearance: Normal appearance. She is not ill-appearing.  HENT:     Head: Normocephalic and atraumatic.     Right Ear: Tympanic membrane, ear canal and external ear normal. There is no impacted cerumen.     Left Ear: Tympanic membrane, ear canal and external ear normal. There is no impacted cerumen.     Nose: Nose normal. No congestion or rhinorrhea.     Mouth/Throat:     Mouth: Mucous membranes are moist.     Pharynx: No oropharyngeal exudate or posterior oropharyngeal erythema.  Eyes:     General: No scleral icterus.       Right eye: No discharge.        Left eye: No discharge.     Extraocular Movements: Extraocular movements intact.     Conjunctiva/sclera: Conjunctivae normal.     Pupils: Pupils are equal, round, and reactive to light.  Neck:     Thyroid: No thyromegaly.     Vascular: No carotid bruit or JVD.     Trachea: Trachea normal.  Cardiovascular:     Rate and Rhythm: Normal rate and regular rhythm.     Pulses: Normal pulses.     Heart sounds: Normal heart sounds. No murmur heard.    No friction rub. No gallop.  Pulmonary:     Effort: Pulmonary effort is normal. No respiratory distress.     Breath sounds: Normal breath sounds. No wheezing.  Abdominal:     General: Bowel sounds are normal. There is no distension.     Palpations: Abdomen is soft.     Tenderness: There is no abdominal tenderness. There is no guarding.  Musculoskeletal:        General: Normal range of motion.     Cervical back: Normal range of motion and neck supple.  Lymphadenopathy:     Cervical: No cervical adenopathy.  Skin:    General: Skin is warm and dry.  Neurological:     Mental Status: She is alert and oriented to person, place, and time.     Cranial Nerves: No cranial nerve deficit.  Psychiatric:        Mood and Affect: Mood normal.        Behavior: Behavior normal.        Thought Content: Thought content normal.        Judgment: Judgment normal.   No results found for any visits on  06/01/23.     Assessment & Plan:    Routine Health Maintenance and Physical Exam  Immunization History  Administered Date(s) Administered   Tdap 10/27/2008, 12/23/2022    Health Maintenance  Topic Date Due   Zoster Vaccines- Shingrix (1 of 2) Never done   INFLUENZA VACCINE  05/28/2023   COVID-19 Vaccine (1 - 2023-24 season) 06/17/2023 (Originally 06/27/2022)   Hepatitis C Screening  05/31/2024 (Originally 01/23/1991)   HIV Screening  05/31/2024 (Originally 01/23/1988)   PAP  SMEAR-Modifier  05/28/2026 (Originally 09/13/2022)   MAMMOGRAM  02/09/2024   DTaP/Tdap/Td (3 - Td or Tdap) 12/23/2032   Colonoscopy  02/16/2033   HPV VACCINES  Aged Out    Discussed health benefits of physical activity, and encouraged her to engage in regular exercise appropriate for her age and condition.  1. Annual physical exam Checking labs as below.  Up-to-date on preventative care.  Wellness information provided with AVS. - Lipid panel - CBC with Differential/Platelet - CMP14+EGFR  2. Elevated blood pressure reading Blood pressure stable.  Continue propranolol ER 60 mg nightly. - propranolol ER (INDERAL LA) 60 MG 24 hr capsule; TAKE 1 CAPSULE BY MOUTH AT  BEDTIME  Dispense: 90 capsule; Refill: 3  3. Lipid screening Checking lipid panel today. - Lipid panel  Return in about 1 year (around 05/31/2024) for annual physical exam or sooner if needed.   Christen Butter, NP

## 2023-06-13 ENCOUNTER — Encounter: Payer: Self-pay | Admitting: Medical-Surgical

## 2023-06-13 DIAGNOSIS — Z780 Asymptomatic menopausal state: Secondary | ICD-10-CM

## 2023-06-22 ENCOUNTER — Encounter: Payer: Self-pay | Admitting: Medical-Surgical

## 2023-07-01 ENCOUNTER — Ambulatory Visit (INDEPENDENT_AMBULATORY_CARE_PROVIDER_SITE_OTHER): Payer: 59

## 2023-07-01 DIAGNOSIS — Z780 Asymptomatic menopausal state: Secondary | ICD-10-CM

## 2023-07-30 ENCOUNTER — Encounter: Payer: Self-pay | Admitting: Medical-Surgical

## 2023-07-30 NOTE — Telephone Encounter (Signed)
I called patient and advised her the orders need to be under [facility].

## 2023-07-31 ENCOUNTER — Encounter: Payer: Self-pay | Admitting: Medical-Surgical

## 2023-07-31 ENCOUNTER — Other Ambulatory Visit: Payer: Self-pay | Admitting: Medical-Surgical

## 2023-07-31 DIAGNOSIS — Z01818 Encounter for other preprocedural examination: Secondary | ICD-10-CM

## 2023-07-31 DIAGNOSIS — Z13220 Encounter for screening for lipoid disorders: Secondary | ICD-10-CM

## 2023-08-17 ENCOUNTER — Encounter: Payer: Self-pay | Admitting: Obstetrics and Gynecology

## 2023-08-17 ENCOUNTER — Ambulatory Visit (INDEPENDENT_AMBULATORY_CARE_PROVIDER_SITE_OTHER): Payer: 59 | Admitting: Obstetrics and Gynecology

## 2023-08-17 VITALS — BP 124/81 | HR 76 | Ht 62.0 in | Wt 145.0 lb

## 2023-08-17 DIAGNOSIS — N951 Menopausal and female climacteric states: Secondary | ICD-10-CM

## 2023-08-17 MED ORDER — ESTRADIOL-LEVONORGESTREL 0.045-0.015 MG/DAY TD PTWK: 1.0000 | MEDICATED_PATCH | TRANSDERMAL | 3 refills | Status: DC

## 2023-08-17 NOTE — Progress Notes (Signed)
GYNECOLOGY OFFICE VISIT NOTE  History:   Patricia Webb is a 52 y.o. G0P0000 here today for symptoms of menopause.  She reports hot flashes, night sweats, weight gain. She also has brain fog. She is doing supplements and diet/exercise to try to improved her symptoms. She also notes acne.    The following portions of the patient's history were reviewed and updated as appropriate: allergies, current medications, past family history, past medical history, past social history, past surgical history and problem list.   Health Maintenance:   Normal pap and negative HRHPV: Diagnosis  Date Value Ref Range Status  09/14/2019   Final   - Negative for intraepithelial lesion or malignancy (NILM)    Normal mammogram on 01/2023.   Review of Systems:  Pertinent items noted in HPI and remainder of comprehensive ROS otherwise negative.  Physical Exam:  BP 124/81   Pulse 76   Ht 5\' 2"  (1.575 m)   Wt 145 lb (65.8 kg)   LMP 08/10/2021 (Approximate)   BMI 26.52 kg/m  CONSTITUTIONAL: Well-developed, well-nourished female in no acute distress.  HEENT:  Normocephalic, atraumatic. External right and left ear normal. No scleral icterus.  NECK: Normal range of motion, supple, no masses noted on observation SKIN: No rash noted. Not diaphoretic. No erythema. No pallor. MUSCULOSKELETAL: Normal range of motion. No edema noted. NEUROLOGIC: Alert and oriented to person, place, and time. Normal muscle tone coordination. No cranial nerve deficit noted. PSYCHIATRIC: Normal mood and affect. Normal behavior. Normal judgment and thought content.   PELVIC: Deferred  Labs and Imaging No results found for this or any previous visit (from the past 168 hour(s)). No results found.  Assessment and Plan:   1. Hot flashes due to menopause - We discussed the treatment options for menopause as well as indications - we discussed both HRT and non-HRT.  - Discussed the benefits of each and relative effectiveness.  -  Discussed goals of therapy i.e. reduction of hot flashes (not complete resolution). Reviewed full response takes up to 2-3 months, including for hormone therapy. - Discussed if HRT we do shortest amount of time at lowest dose. We discussed annual attempts at coming of the HRT typically in the fall months when the weather has cooled down. - We discussed the differences in modes of therapy for HRT -- patch vs oral therapy.  - Discussed risks of HRT: E+P = breast cancer, clotting, MI/Stroke. Discussed risk of E alone. We reviewed that limits of data from the [facility] regarding breast cancer impact - only progesterone used in that study was provera which is biologically active in the best. We MAY reduce that risk by doing a different progesterone based therapy I.e. norethindrone. We reviewed the indication and necessity for progesterone and that estrogen alone in those with a uterus have an increased risk of endometrial cancer - Discussed genitourinary symptoms i.e. urinary issues, vaginal dryness and dyspareunia and that for these symptoms, local treatment is best. - She would like: HRT via patch -     estradiol-levonorgestrel (CLIMARAPRO) 0.045-0.015 MG/DAY; Place 1 patch onto the skin once a week.   Meds ordered this encounter  Medications   estradiol-levonorgestrel (CLIMARAPRO) 0.045-0.015 MG/DAY    Sig: Place 1 patch onto the skin once a week.    Dispense:  12 patch    Refill:  3     Routine preventative health maintenance measures emphasized. Please refer to After Visit Summary for other counseling recommendations.   No follow-ups on file.  Gunnar Fusi  Para March, MD, FACOG Obstetrician & Gynecologist, [facility] for [employer], [facility]

## 2023-08-17 NOTE — Telephone Encounter (Signed)
Patient advised.

## 2023-08-18 LAB — CBC WITH DIFFERENTIAL/PLATELET
Basophils Absolute: 0 10*3/uL (ref 0.0–0.2)
Basos: 0 %
EOS (ABSOLUTE): 0.2 10*3/uL (ref 0.0–0.4)
Eos: 2 %
Hematocrit: 39.8 % (ref 34.0–46.6)
Hemoglobin: 13.4 g/dL (ref 11.1–15.9)
Immature Grans (Abs): 0 10*3/uL (ref 0.0–0.1)
Immature Granulocytes: 0 %
Lymphocytes Absolute: 2.8 10*3/uL (ref 0.7–3.1)
Lymphs: 36 %
MCH: 31.1 pg (ref 26.6–33.0)
MCHC: 33.7 g/dL (ref 31.5–35.7)
MCV: 92 fL (ref 79–97)
Monocytes Absolute: 0.8 10*3/uL (ref 0.1–0.9)
Monocytes: 10 %
Neutrophils Absolute: 4 10*3/uL (ref 1.4–7.0)
Neutrophils: 52 %
Platelets: 290 10*3/uL (ref 150–450)
RBC: 4.31 x10E6/uL (ref 3.77–5.28)
RDW: 13.2 % (ref 11.7–15.4)
WBC: 7.9 10*3/uL (ref 3.4–10.8)

## 2023-08-18 LAB — CMP14+EGFR
ALT: 44 [IU]/L — ABNORMAL HIGH (ref 0–32)
AST: 33 [IU]/L (ref 0–40)
Albumin: 4.5 g/dL (ref 3.9–4.9)
Alkaline Phosphatase: 126 [IU]/L — ABNORMAL HIGH (ref 44–121)
BUN/Creatinine Ratio: 29 — ABNORMAL HIGH (ref 9–23)
BUN: 19 mg/dL (ref 6–24)
Bilirubin Total: 0.2 mg/dL (ref 0.0–1.2)
CO2: 23 mmol/L (ref 20–29)
Calcium: 10.2 mg/dL (ref 8.7–10.2)
Chloride: 103 mmol/L (ref 96–106)
Creatinine, Ser: 0.66 mg/dL (ref 0.57–1.00)
Globulin, Total: 2.5 g/dL (ref 1.5–4.5)
Glucose: 86 mg/dL (ref 70–99)
Potassium: 4.1 mmol/L (ref 3.5–5.2)
Sodium: 140 mmol/L (ref 134–144)
Total Protein: 7 g/dL (ref 6.0–8.5)
eGFR: 107 mL/min/{1.73_m2} (ref >59)

## 2023-08-18 LAB — LIPID PANEL
Chol/HDL Ratio: 4 {ratio} (ref 0.0–4.4)
Cholesterol, Total: 271 mg/dL — ABNORMAL HIGH (ref 100–199)
HDL: 67 mg/dL (ref >39)
LDL Chol Calc (NIH): 153 mg/dL — ABNORMAL HIGH (ref 0–99)
Triglycerides: 280 mg/dL — ABNORMAL HIGH (ref 0–149)
VLDL Cholesterol Cal: 51 mg/dL — ABNORMAL HIGH (ref 5–40)

## 2023-08-18 LAB — HEMOGLOBIN A1C
Est. average glucose Bld gHb Est-mCnc: 103 mg/dL
Hgb A1c MFr Bld: 5.2 % (ref 4.8–5.6)

## 2023-09-14 ENCOUNTER — Encounter: Payer: Self-pay | Admitting: Medical-Surgical

## 2023-09-30 ENCOUNTER — Encounter: Payer: Self-pay | Admitting: Medical-Surgical

## 2023-10-02 ENCOUNTER — Encounter: Payer: Self-pay | Admitting: Obstetrics and Gynecology

## 2023-10-02 DIAGNOSIS — N951 Menopausal and female climacteric states: Secondary | ICD-10-CM

## 2023-11-11 MED ORDER — ESTRADIOL 0.05 MG/24HR TD PTTW: 1.0000 | MEDICATED_PATCH | TRANSDERMAL | 12 refills | Status: DC

## 2023-11-11 MED ORDER — PROGESTERONE MICRONIZED 100 MG PO CAPS: 100.0000 mg | ORAL_CAPSULE | Freq: Every day | ORAL | 12 refills | Status: AC

## 2023-11-11 NOTE — Addendum Note (Signed)
 Addended by: Lacey Pian A on: 11/11/2023 09:17 AM   Modules accepted: Orders

## 2024-01-05 ENCOUNTER — Telehealth: Admitting: Physician Assistant

## 2024-01-05 DIAGNOSIS — K219 Gastro-esophageal reflux disease without esophagitis: Secondary | ICD-10-CM

## 2024-01-05 MED ORDER — OMEPRAZOLE 40 MG PO CPDR: 40.0000 mg | DELAYED_RELEASE_CAPSULE | Freq: Every day | ORAL | 0 refills | Status: DC

## 2024-01-05 NOTE — Progress Notes (Signed)
E-Visit for Heartburn  We are sorry that you are not feeling well.  Here is how we plan to help!  Based on what you shared with me it looks like you most likely have Gastroesophageal Reflux Disease (GERD)  Gastroesophageal reflux disease (GERD) happens when acid from your stomach flows up into the esophagus.  When acid comes in contact with the esophagus, the acid causes sorenss (inflammation) in the esophagus.  Over time, GERD may create small holes (ulcers) in the lining of the esophagus.  I have prescribed Omeprazole 40 mg one by mouth daily until you follow up with a provider.  Your symptoms should improve in the next day or two.  You can use antacids as needed until symptoms resolve.  Call us if your heartburn worsens, you have trouble swallowing, weight loss, spitting up blood or recurrent vomiting.  Home Care: May include lifestyle changes such as weight loss, quitting smoking and alcohol consumption Avoid foods and drinks that make your symptoms worse, such as: Caffeine or alcoholic drinks Chocolate Peppermint or mint flavorings Garlic and onions Spicy foods Citrus fruits, such as oranges, lemons, or limes Tomato-based foods such as sauce, chili, salsa and pizza Fried and fatty foods Avoid lying down for 3 hours prior to your bedtime or prior to taking a nap Eat small, frequent meals instead of a large meals Wear loose-fitting clothing.  Do not wear anything tight around your waist that causes pressure on your stomach. Raise the head of your bed 6 to 8 inches with wood blocks to help you sleep.  Extra pillows will not help.  Seek Help Right Away If: You have pain in your arms, neck, jaw, teeth or back Your pain increases or changes in intensity or duration You develop nausea, vomiting or sweating (diaphoresis) You develop shortness of breath or you faint Your vomit is green, yellow, black or looks like coffee grounds or blood Your stool is red, bloody or black  These  symptoms could be signs of other problems, such as heart disease, gastric bleeding or esophageal bleeding.  Make sure you : Understand these instructions. Will watch your condition. Will get help right away if you are not doing well or get worse.  Your e-visit answers were reviewed by a board certified advanced clinical practitioner to complete your personal care plan.  Depending on the condition, your plan could have included both over the counter or prescription medications.  If there is a problem please reply  once you have received a response from your provider.  Your safety is important to [place].  If you have drug allergies check your prescription carefully.    You can use MyChart to ask questions about today's visit, request a non-urgent call back, or ask for a work or school excuse for 24 hours related to this e-Visit. If it has been greater than 24 hours you will need to follow up with your provider, or enter a new e-Visit to address those concerns.  You will get an e-mail in the next two days asking about your experience.  I hope that your e-visit has been valuable and will speed your recovery. Thank you for using e-visits.  I have spent 5 minutes in review of e-visit questionnaire, review and updating patient chart, medical decision making and response to patient.   Mar Daring, PA-C

## 2024-01-21 ENCOUNTER — Ambulatory Visit: Admitting: Medical-Surgical

## 2024-01-28 ENCOUNTER — Other Ambulatory Visit (HOSPITAL_COMMUNITY): Payer: Self-pay

## 2024-01-28 ENCOUNTER — Encounter: Payer: Self-pay | Admitting: Medical-Surgical

## 2024-01-28 ENCOUNTER — Ambulatory Visit (INDEPENDENT_AMBULATORY_CARE_PROVIDER_SITE_OTHER): Admitting: Medical-Surgical

## 2024-01-28 ENCOUNTER — Telehealth: Payer: Self-pay

## 2024-01-28 VITALS — BP 102/70 | HR 84 | Resp 20 | Ht 62.0 in | Wt 156.9 lb

## 2024-01-28 DIAGNOSIS — K219 Gastro-esophageal reflux disease without esophagitis: Secondary | ICD-10-CM | POA: Diagnosis not present

## 2024-01-28 DIAGNOSIS — R059 Cough, unspecified: Secondary | ICD-10-CM

## 2024-01-28 LAB — POCT INFLUENZA A/B
Influenza A, POC: NEGATIVE
Influenza B, POC: NEGATIVE

## 2024-01-28 LAB — POC COVID19 BINAXNOW: SARS Coronavirus 2 Ag: NEGATIVE

## 2024-01-28 MED ORDER — PANTOPRAZOLE SODIUM 40 MG PO TBEC: 40.0000 mg | DELAYED_RELEASE_TABLET | Freq: Every day | ORAL | 3 refills | Status: DC

## 2024-01-28 MED ORDER — SUCRALFATE 1 G PO TABS: 1.0000 g | ORAL_TABLET | Freq: Four times a day (QID) | ORAL | 0 refills | Status: DC

## 2024-01-28 MED ORDER — FAMOTIDINE 20 MG PO TABS: 20.0000 mg | ORAL_TABLET | Freq: Two times a day (BID) | ORAL | 1 refills | Status: DC

## 2024-01-28 NOTE — Telephone Encounter (Signed)
 Pharmacy Patient Advocate Encounter  Received notification from [facility] that Prior Authorization for Famotidine 20 has been DENIED.  Full denial letter will be uploaded to the media tab. See denial reason below.   PA #/Case ID/Reference #: -[id number]

## 2024-01-28 NOTE — Telephone Encounter (Signed)
 Pharmacy Patient Advocate Encounter   Received notification from Patient Pharmacy that prior authorization for Famotidine 20 is required/requested.   Insurance verification completed.   The patient is insured through [facility] MEDICAID .   Per test claim: PA required; PA submitted to above mentioned insurance via CoverMyMeds Key/confirmation #/EOC [id number] Status is pending

## 2024-01-28 NOTE — Progress Notes (Signed)
        Established patient visit  History, exam, impression, and plan:  1. Gastroesophageal reflux disease, unspecified whether esophagitis present (Primary) Pleasant 53 year old female presenting today with a history of GERD.  She has had 1 month of worsening of symptoms including burning in the epigastric region and the chest.  Previously happened when drinking alcohol however now is occurring with any food intake.  She is having difficulty lying flat and feels that the stomach contents are ready to come back up.  Bending over is difficult to tolerate.  She travels a lot so her diet is not always ideal.  She he was taking Tums and omeprazole however stopped these and is now taking an herbal supplement of digestive enzymes and reflux relief.  These are helping somewhat however the symptoms have continued to worsen.  She is currently working on losing weight which should help overall.  Endorses some intermittent nausea but no vomiting.  She has cut out all carbonation as well as decreased her alcohol intake.  The last 2 days, she has had diarrhea but prior to that her bowel movements were regular.  Has not seen any hematochezia or melena.  Exam unremarkable with no areas of tenderness.  Bowel sounds present x 4 quadrants.  No HSM.  Checking for H. pylori today as she has been off of a PPI for at least 7 days.  Switching from omeprazole to Protonix 40 mg daily.  Adding famotidine 20 mg twice daily.  We will add Carafate 4 times daily for the next month.  Work on avoiding known food triggers as much as possible. - H. pylori breath test  2. Cough, unspecified type A couple of days ago, she returned from a month of traveling to multiple places.  She has developed a sore throat followed by a mild cough, yellow nasal congestion, ear pain, and fatigue.  Has not tried any medications for symptom management.  Exam positive for posterior oropharyngeal erythema and mild tonsillar enlargement.  Lungs CTA,  respirations even and unlabored.  HRRR, S1/S2 normal.  No cervical lymphadenopathy.  POCT flu and COVID-negative.  Symptoms are consistent with viral URI.  Recommend conservative measures with home remedies and over-the-counter cold/flu medications.  Recommend Coricidin or NyQuil/DayQuil HBP. - POCT Influenza A/B - POC COVID-19   Procedures performed this visit: None.  Return if symptoms worsen or fail to improve.  __________________________________ Thayer Ohm, DNP, APRN, FNP-BC Primary Care and Sports Medicine [facility] [place]

## 2024-01-29 ENCOUNTER — Encounter: Payer: Self-pay | Admitting: Medical-Surgical

## 2024-01-30 LAB — SPECIMEN STATUS REPORT

## 2024-01-30 LAB — H. PYLORI BREATH TEST: H pylori Breath Test: NEGATIVE

## 2024-02-01 ENCOUNTER — Encounter: Payer: Self-pay | Admitting: Medical-Surgical

## 2024-03-20 ENCOUNTER — Ambulatory Visit
Admission: EM | Admit: 2024-03-20 | Discharge: 2024-03-20 | Disposition: A | Discharge status: Home / Self Care | Attending: Emergency Medicine | Admitting: Emergency Medicine

## 2024-03-20 ENCOUNTER — Encounter: Payer: Self-pay | Admitting: Emergency Medicine

## 2024-03-20 DIAGNOSIS — R238 Other skin changes: Secondary | ICD-10-CM

## 2024-03-20 DIAGNOSIS — R21 Rash and other nonspecific skin eruption: Secondary | ICD-10-CM

## 2024-03-20 MED ORDER — VALACYCLOVIR HCL 1 G PO TABS: 1000.0000 mg | ORAL_TABLET | Freq: Three times a day (TID) | ORAL | 0 refills | Status: AC

## 2024-03-20 NOTE — ED Triage Notes (Signed)
 Patient c/o pain and tingling in her back, concern for shingles since yesterday.  No rash or blisters, pain radiates more on the left side.  Has not had shingles in the past but has had chicken pox in th past.  Has taken Benadryl  this morning.

## 2024-03-20 NOTE — ED Provider Notes (Signed)
 [facility]    CSN: [id number] Arrival date & time: 03/20/24  1053      History   Chief Complaint Chief Complaint  Patient presents with   Possible shingles    HPI Patricia Webb is a 53 y.o. female.   Patient presents to clinic over concern of sensitive skin that is above her bra line across her back.  It feels like the rash is crescent shaped.  Noticed the area yesterday.  Felt like yesterday the area wrapped around bilaterally to the front of her chest and under her breast with skin sensitivity.  Has not had any skin changes anteriorly.  Does have a history of chickenpox, has never had shingles.  She is concerned for shingles because her mother had shingles in the past and it was very painful.  The rash is not warm, erythematous or vesicular.  She did take Benadryl  this morning.  Denies pain with bending or twisting.  Has not had any recent injury, trauma or falls.  Denies poison ivy exposure.  The history is provided by the patient and medical records.    Past Medical History:  Diagnosis Date   GAD (generalized anxiety disorder)    GERD (gastroesophageal reflux disease)    History of concussion 05/2021   evaluated by neurologist--- dr Kirk Peper. Billy Bue (lov in epic 07-25-2021  stated pt fell on vacation LOC had headaches for a week, post concussion syndrome with no memory of accident per pt)   History of panic disorder    Hypertension    PMB (postmenopausal bleeding)    Wears contact lenses     Patient Active Problem List   Diagnosis Date Noted   Osteoarthritis of one hip, left 04/08/2023   Anxiety 12/30/2019   Fibrocystic breast 12/30/2019   GERD (gastroesophageal reflux disease) 12/30/2019   Low back pain 12/30/2019   Poor venous access 12/27/2018   Panic disorder (episodic paroxysmal anxiety) 09/09/2018   Ovarian cyst, right 06/02/2017    Past Surgical History:  Procedure Laterality Date   HYSTEROSCOPY WITH D & C N/A 12/23/2022   Procedure: DILATATION  AND CURETTAGE /HYSTEROSCOPY AND TDAP UNDER ANESTHESIA;  Surgeon: Izell Marsh, MD;  Location: [facility];  Service: Gynecology;  Laterality: N/A;   KNEE ARTHROSCOPY W/ ACL RECONSTRUCTION Left 11/14/2015   in [place]   LUMBAR MICRODISCECTOMY     03/ 2010  and 03/ 2011  both L4--5   WISDOM TOOTH EXTRACTION Left    one upper and one lower left side    OB History     Gravida  0   Para  0   Term  0   Preterm  0   AB  0   Living  0      SAB  0   IAB  0   Ectopic  0   Multiple  0   Live Births  0            Home Medications    Prior to Admission medications   Medication Sig Start Date End Date Taking? Authorizing Provider  acetaminophen  (TYLENOL ) 500 MG tablet Take 1,000 mg by mouth every 6 (six) hours as needed for moderate pain.   Yes [provider]  Aspirin-Caffeine (BAYER BACK & BODY) 500-32.5 MG TABS Take 2 tablets by mouth daily as needed (pain).   Yes [provider]  COLLAGEN PO Take 15 mLs by mouth daily.   Yes [provider]  diphenhydrAMINE  HCl, Sleep, (ZZZQUIL)  50 MG/30ML LIQD Take 50 mg by mouth at bedtime as needed (sleep).   Yes [provider]  estradiol  (VIVELLE -DOT) 0.05 MG/24HR patch Place 1 patch (0.05 mg total) onto the skin 2 (two) times a week. 11/12/23  Yes Lacey Pian, MD  famotidine  (PEPCID ) 20 MG tablet Take 1 tablet (20 mg total) by mouth 2 (two) times daily. 01/28/24  Yes Cherre Cornish, NP  Multiple Vitamin (MULTIVITAMIN WITH MINERALS) TABS tablet Take 1 tablet by mouth daily.   Yes [provider]  pantoprazole  (PROTONIX ) 40 MG tablet Take 1 tablet (40 mg total) by mouth daily. 01/28/24  Yes Cherre Cornish, NP  progesterone  (PROMETRIUM ) 100 MG capsule Take 1 capsule (100 mg total) by mouth daily. 11/11/23  Yes Lacey Pian, MD  propranolol  ER (INDERAL  LA) 60 MG 24 hr capsule TAKE 1 CAPSULE BY MOUTH AT  BEDTIME 06/01/23  Yes Jessup, Joy, NP  sucralfate  (CARAFATE ) 1 g tablet  Take 1 tablet (1 g total) by mouth 4 (four) times daily. 01/28/24  Yes Cherre Cornish, NP  TRETINOIN EX Apply topically 3 (three) times a week.   Yes [provider]  valACYclovir (VALTREX) 1000 MG tablet Take 1 tablet (1,000 mg total) by mouth 3 (three) times daily for 14 days. 03/20/24 04/03/24 Yes Harlow Lighter, Deneisha Dade  N, FNP    Family History Family History  Problem Relation Age of Onset   Healthy Mother    Colon polyps Father    Healthy Father    Obesity Paternal Aunt    Diabetes Paternal Grandmother    Colon cancer Neg Hx    Esophageal cancer Neg Hx    Rectal cancer Neg Hx    Stomach cancer Neg Hx     Social History Social History   Tobacco Use   Smoking status: Never   Smokeless tobacco: Never  Vaping Use   Vaping status: Never Used  Substance Use Topics   Alcohol use: Yes    Alcohol/week: 4.0 - 5.0 standard drinks of alcohol    Types: 4 - 5 Glasses of wine per week    Comment: occ   Drug use: Never     Allergies   Patient has no known allergies.   Review of Systems Review of Systems  Per HPI  Physical Exam Triage Vital Signs ED Triage Vitals  Encounter Vitals Group     BP 03/20/24 1108 113/78     Systolic BP Percentile --      Diastolic BP Percentile --      Pulse Rate 03/20/24 1108 75     Resp 03/20/24 1108 18     Temp 03/20/24 1108 98.7 F (37.1 C)     Temp Source 03/20/24 1108 Oral     SpO2 03/20/24 1108 96 %     Weight 03/20/24 1109 150 lb (68 kg)     Height 03/20/24 1109 5\' 2"  (1.575 m)     Head Circumference --      Peak Flow --      Pain Score 03/20/24 1109 5     Pain Loc --      Pain Education --      Exclude from Growth Chart --    No data found.  Updated Vital Signs BP 113/78 (BP Location: Right Arm)   Pulse 75   Temp 98.7 F (37.1 C) (Oral)   Resp 18   Ht 5\' 2"  (1.575 m)   Wt 150 lb (68 kg)   LMP 08/10/2021 (Approximate)   SpO2 96%  BMI 27.44 kg/m   Visual Acuity Right Eye Distance:   Left Eye Distance:   Bilateral  Distance:    Right Eye Near:   Left Eye Near:    Bilateral Near:     Physical Exam Vitals and nursing note reviewed.  Constitutional:      Appearance: Normal appearance.  HENT:     Head: Normocephalic and atraumatic.     Right Ear: External ear normal.     Left Ear: External ear normal.     Nose: Nose normal.     Mouth/Throat:     Mouth: Mucous membranes are moist.  Eyes:     Conjunctiva/sclera: Conjunctivae normal.  Cardiovascular:     Rate and Rhythm: Normal rate.  Pulmonary:     Effort: Pulmonary effort is normal. No respiratory distress.  Skin:    General: Skin is warm and dry.     Findings: Rash present.          Comments: Very faint slightly darker skin above the bra line that is questioned shaped and crosses the spine.  Area is not warm, tender, swollen or vesicular.  Neurological:     General: No focal deficit present.     Mental Status: She is alert.  Psychiatric:        Mood and Affect: Mood normal.      UC Treatments / Results  Labs (all labs ordered are listed, but only abnormal results are displayed) Labs Reviewed - No data to display  EKG   Radiology No results found.  Procedures Procedures (including critical care time)  Medications Ordered in UC Medications - No data to display  Initial Impression / Assessment and Plan / UC Course  I have reviewed the triage vital signs and the nursing notes.  Pertinent labs & imaging results that were available during my care of the patient were reviewed by me and considered in my medical decision making (see chart for details).  Vitals in triage reviewed, patient is hemodynamically stable.  Faint slightly darker skin that is question shaped across her back, and consistent with shingles presentation.  Does not appear to be bacterial, allergic or fungal in nature.  Unclear etiology, encouraged to document with pictures and follow-up with PCP if this persist.  If she does develop vesicular lesions across a  single dermatome, have sent in Valtrex for treatment of shingles.  Plan of care, follow-up care return precautions given, no questions at this time.     Final Clinical Impressions(s) / UC Diagnoses   Final diagnoses:  Skin sensitivity  Rash and nonspecific skin eruption     Discharge Instructions      Currently, the sensitive skin area does not appear to be shingles as it crosses over the midline.  If you do develop blistered lesions, please start the Valtrex and take them 3 times daily until the blistered areas crusted over.  I recommend taking pictures of the rash daily and tracking any changes to follow-up with primary care provider.  For pain you can take 800 mg of ibuprofen every 8 hours as needed.  Return to clinic for new or urgent symptoms.  ED Prescriptions     Medication Sig Dispense Auth. Provider   valACYclovir (VALTREX) 1000 MG tablet Take 1 tablet (1,000 mg total) by mouth 3 (three) times daily for 14 days. 42 tablet Harlow Lighter, Arvine Clayburn  N, FNP      PDMP not reviewed this encounter.   Zelphia Higashi, FNP 03/20/24 1126

## 2024-03-20 NOTE — Discharge Instructions (Signed)
 Currently, the sensitive skin area does not appear to be shingles as it crosses over the midline.  If you do develop blistered lesions, please start the Valtrex and take them 3 times daily until the blistered areas crusted over.  I recommend taking pictures of the rash daily and tracking any changes to follow-up with primary care provider.  For pain you can take 800 mg of ibuprofen every 8 hours as needed.  Return to clinic for new or urgent symptoms.

## 2024-05-18 ENCOUNTER — Other Ambulatory Visit: Payer: Self-pay | Admitting: Medical-Surgical

## 2024-06-13 ENCOUNTER — Other Ambulatory Visit: Payer: Self-pay

## 2024-06-13 DIAGNOSIS — R030 Elevated blood-pressure reading, without diagnosis of hypertension: Secondary | ICD-10-CM

## 2024-06-13 MED ORDER — PROPRANOLOL HCL ER 60 MG PO CP24: ORAL_CAPSULE | ORAL | 0 refills | Status: DC

## 2024-06-21 ENCOUNTER — Other Ambulatory Visit: Payer: Self-pay | Admitting: Medical-Surgical

## 2024-07-06 ENCOUNTER — Encounter: Payer: Self-pay | Admitting: Medical-Surgical

## 2024-07-06 ENCOUNTER — Ambulatory Visit (INDEPENDENT_AMBULATORY_CARE_PROVIDER_SITE_OTHER): Admitting: Medical-Surgical

## 2024-07-06 VITALS — BP 110/75 | HR 77 | Resp 20 | Ht 62.0 in | Wt 167.0 lb

## 2024-07-06 DIAGNOSIS — R635 Abnormal weight gain: Secondary | ICD-10-CM

## 2024-07-06 DIAGNOSIS — K219 Gastro-esophageal reflux disease without esophagitis: Secondary | ICD-10-CM

## 2024-07-06 DIAGNOSIS — I10 Essential (primary) hypertension: Secondary | ICD-10-CM

## 2024-07-06 DIAGNOSIS — Z0000 Encounter for general adult medical examination without abnormal findings: Secondary | ICD-10-CM | POA: Diagnosis not present

## 2024-07-06 MED ORDER — PROPRANOLOL HCL ER 60 MG PO CP24: 60.0000 mg | ORAL_CAPSULE | Freq: Every day | ORAL | 3 refills | Status: AC

## 2024-07-06 MED ORDER — TIRZEPATIDE 10 MG/0.5ML ~~LOC~~ SOAJ: SUBCUTANEOUS | 33 refills | Status: DC

## 2024-07-06 MED ORDER — PANTOPRAZOLE SODIUM 20 MG PO TBEC: 20.0000 mg | DELAYED_RELEASE_TABLET | Freq: Every day | ORAL | 0 refills | Status: DC

## 2024-07-06 MED ORDER — TIRZEPATIDE 10 MG/0.5ML ~~LOC~~ SOAJ: SUBCUTANEOUS | 33 refills | Status: AC

## 2024-07-06 NOTE — Progress Notes (Signed)
 Complete physical exam  Patient: Patricia Webb   DOB: [birth year]   53 y.o. Female  MRN: [id number]  Subjective:    Chief Complaint  Patient presents with   Annual Exam    Patricia Webb is a 53 y.o. female who presents today for a complete physical exam. She reports consuming a general diet. Exercising regularly. She generally feels well. She reports sleeping poorly. She does not have additional problems to discuss today.    Most recent fall risk assessment:    07/06/2024   10:44 AM  Fall Risk   Falls in the past year? 0  Number falls in past yr: 0  Injury with Fall? 0  Risk for fall due to : No Fall Risks  Follow up Falls evaluation completed     Most recent depression screenings:    07/06/2024   10:44 AM 01/28/2024    1:07 PM  PHQ 2/9 Scores  PHQ - 2 Score 0 1  PHQ- 9 Score 3 8    Vision:Within last year, Dental: No current dental problems and Receives regular dental care, and STD: The patient denies history of sexually transmitted disease.    Patient Care Team: Willo Mini, NP as PCP - General (Nurse Practitioner)   Outpatient Medications Prior to Visit  Medication Sig   acetaminophen  (TYLENOL ) 500 MG tablet Take 1,000 mg by mouth every 6 (six) hours as needed for moderate pain.   Aspirin-Caffeine (BAYER BACK & BODY) 500-32.5 MG TABS Take 2 tablets by mouth daily as needed (pain).   COLLAGEN PO Take 15 mLs by mouth daily.   diphenhydrAMINE  HCl, Sleep, (ZZZQUIL) 50 MG/30ML LIQD Take 50 mg by mouth at bedtime as needed (sleep).   estradiol  (VIVELLE -DOT) 0.05 MG/24HR patch Place 1 patch (0.05 mg total) onto the skin 2 (two) times a week.   Multiple Vitamin (MULTIVITAMIN WITH MINERALS) TABS tablet Take 1 tablet by mouth daily.   progesterone  (PROMETRIUM ) 100 MG capsule Take 1 capsule (100 mg total) by mouth daily.   TRETINOIN EX Apply topically 3 (three) times a week.   [DISCONTINUED] pantoprazole  (PROTONIX ) 40 MG tablet TAKE 1 TABLET BY MOUTH ONCE DAILY .  APPOINTMENT REQUIRED FOR FUTURE REFILLS   [DISCONTINUED] propranolol  ER (INDERAL  LA) 60 MG 24 hr capsule NEEDS APPOINTMENT FOR FURTHER REFILLS. TAKE 1 CAPSULE BY MOUTH AT  BEDTIME   [DISCONTINUED] TIRZEPATIDE  [place] Inject into the skin once a week.   [DISCONTINUED] famotidine  (PEPCID ) 20 MG tablet Take 1 tablet (20 mg total) by mouth 2 (two) times daily.   [DISCONTINUED] sucralfate  (CARAFATE ) 1 g tablet Take 1 tablet (1 g total) by mouth 4 (four) times daily.   No facility-administered medications prior to visit.    Review of Systems  Constitutional:  Positive for malaise/fatigue. Negative for chills, fever and weight loss.       Weight gain  HENT:  Negative for congestion, ear pain, hearing loss, sinus pain and sore throat.   Eyes:  Negative for blurred vision, photophobia and pain.  Respiratory:  Negative for cough, shortness of breath and wheezing.   Cardiovascular:  Negative for chest pain, palpitations and leg swelling.  Gastrointestinal:  Negative for abdominal pain, constipation, diarrhea, heartburn, nausea and vomiting.  Genitourinary:  Negative for dysuria, frequency and urgency.  Musculoskeletal:  Negative for falls and neck pain.  Skin:  Negative for itching and rash.  Neurological:  Negative for dizziness, weakness and headaches.  Endo/Heme/Allergies:  Negative for polydipsia. Does not bruise/bleed easily.  Psychiatric/Behavioral:  Negative  for depression, substance abuse and suicidal ideas. The patient has insomnia. The patient is not nervous/anxious.      Objective:     BP 110/75 (BP Location: Left Arm, Cuff Size: Normal)   Pulse 77   Resp 20   Ht 5' 2 (1.575 m)   Wt 167 lb (75.8 kg)   LMP 08/10/2021 (Approximate)   SpO2 97%   BMI 30.54 kg/m    Physical Exam Constitutional:      General: She is not in acute distress.    Appearance: Normal appearance. She is not ill-appearing.  HENT:     Head: Normocephalic and atraumatic.     Right Ear: Tympanic membrane, ear  canal and external ear normal. There is no impacted cerumen.     Left Ear: Tympanic membrane, ear canal and external ear normal. There is no impacted cerumen.     Nose: Nose normal. No congestion or rhinorrhea.     Mouth/Throat:     Mouth: Mucous membranes are moist.     Pharynx: No oropharyngeal exudate or posterior oropharyngeal erythema.  Eyes:     General: No scleral icterus.       Right eye: No discharge.        Left eye: No discharge.     Extraocular Movements: Extraocular movements intact.     Conjunctiva/sclera: Conjunctivae normal.     Pupils: Pupils are equal, round, and reactive to light.  Neck:     Thyroid: No thyromegaly.     Vascular: No carotid bruit or JVD.     Trachea: Trachea normal.  Cardiovascular:     Rate and Rhythm: Normal rate and regular rhythm.     Pulses: Normal pulses.     Heart sounds: Normal heart sounds. No murmur heard.    No friction rub. No gallop.  Pulmonary:     Effort: Pulmonary effort is normal. No respiratory distress.     Breath sounds: Normal breath sounds. No wheezing.  Abdominal:     General: Bowel sounds are normal. There is no distension.     Palpations: Abdomen is soft.     Tenderness: There is abdominal tenderness in the right lower quadrant. There is no guarding.  Musculoskeletal:        General: Normal range of motion.     Cervical back: Normal range of motion and neck supple.  Lymphadenopathy:     Cervical: No cervical adenopathy.  Skin:    General: Skin is warm and dry.  Neurological:     Mental Status: She is alert and oriented to person, place, and time.     Cranial Nerves: No cranial nerve deficit.  Psychiatric:        Mood and Affect: Mood normal.        Behavior: Behavior normal.        Thought Content: Thought content normal.        Judgment: Judgment normal.      No results found for any visits on 07/06/24.     Assessment & Plan:    Routine Health Maintenance and Physical Exam  Immunization History   Administered Date(s) Administered   Tdap 10/27/2008, 12/23/2022    Health Maintenance  Topic Date Due   HIV Screening  Never done   Hepatitis C Screening  Never done   Hepatitis B Vaccines 19-59 Average Risk (1 of 3 - 19+ 3-dose series) Never done   Pneumococcal Vaccine: 50+ Years (1 of 1 - PCV) Never done   Zoster Vaccines-  Shingrix (1 of 2) Never done   MAMMOGRAM  02/09/2024   COVID-19 Vaccine (1 - 2024-25 season) 07/22/2024 (Originally 06/27/2024)   Influenza Vaccine  01/24/2025 (Originally 05/27/2024)   Cervical Cancer Screening (HPV/Pap Cotest)  09/13/2024   DTaP/Tdap/Td (3 - Td or Tdap) 12/23/2032   Colonoscopy  02/16/2033   HPV VACCINES  Aged Out   Meningococcal B Vaccine  Aged Out    Discussed health benefits of physical activity, and encouraged her to engage in regular exercise appropriate for her age and condition.  1. Annual physical exam (Primary) Checking labs as below. UTD on preventative care. Wellness information provided with AVS. - CBC with Differential/Platelet - CMP14+EGFR - Lipid panel  2. Primary hypertension Blood pressure stable.  Continue propranolol  ER 60 mg daily. - propranolol  ER (INDERAL  LA) 60 MG 24 hr capsule; Take 1 capsule (60 mg total) by mouth daily.  Dispense: 90 capsule; Refill: 3  3. Gastroesophageal reflux disease, unspecified whether esophagitis present Well-managed with Protonix  40 mg daily but open to reducing medication use if no longer needed.  Reviewed the recommendations for using the smallest dose effective for the shortest time needed.  We will reduce her Protonix  to 20 mg daily to see if this is still effective then okay to try 20 mg daily as needed.  If ineffective, plan to go back to 40 mg daily.  Reviewed risks of the medication including worsened bone loss and increased risk of GI infections. - pantoprazole  (PROTONIX ) 20 MG tablet; Take 1 tablet (20 mg total) by mouth daily.  Dispense: 30 tablet; Refill: 0  4. Abnormal weight  gain Has been using compounded tirzepatide  through an online program but feels that the medication is not working well for her and has only lost 3 pounds.  Reviewed options.  Not covered by her insurance but we did discuss switching her to a Aeronautical engineer.  She is amenable to this given the cheaper cost.  Sending [facility]o [facility] in [place]. - tirzepatide  (MOUNJARO ) 10 MG/0.5ML Pen; Liposlim.  Tirzepatide /Pyridoxine/Thiamine/L-Carnitine 10mg /mL.  Inject 2.5 mg/25 units subcu weekly for 4 weeks then 5 mg/50 units subcu weekly for 4 weeks then 7.5 mg/75 units subcu weekly for 4 weeks then 10 mg/100 units subcu weekly for 4 weeks then 15 mg/150 units subcu weekly  Dispense: 3 mL; Refill: 33   Return in about 6 months (around 01/03/2025) for weight follow up.     Kem Parcher, NP

## 2024-07-10 ENCOUNTER — Encounter: Payer: Self-pay | Admitting: Medical-Surgical

## 2024-07-29 LAB — HM MAMMOGRAPHY

## 2024-07-30 ENCOUNTER — Other Ambulatory Visit: Payer: Self-pay | Admitting: Medical-Surgical

## 2024-07-30 DIAGNOSIS — K219 Gastro-esophageal reflux disease without esophagitis: Secondary | ICD-10-CM

## 2024-08-31 ENCOUNTER — Encounter: Payer: Self-pay | Admitting: Medical-Surgical

## 2024-08-31 ENCOUNTER — Other Ambulatory Visit: Payer: Self-pay | Admitting: Medical-Surgical

## 2024-08-31 DIAGNOSIS — K219 Gastro-esophageal reflux disease without esophagitis: Secondary | ICD-10-CM

## 2024-08-31 MED ORDER — PANTOPRAZOLE SODIUM 40 MG PO TBEC: 40.0000 mg | DELAYED_RELEASE_TABLET | Freq: Every day | ORAL | 1 refills | Status: AC

## 2024-10-25 ENCOUNTER — Other Ambulatory Visit: Payer: Self-pay | Admitting: Obstetrics and Gynecology

## 2024-10-25 DIAGNOSIS — N951 Menopausal and female climacteric states: Secondary | ICD-10-CM

## 2024-11-01 ENCOUNTER — Encounter: Payer: Self-pay | Admitting: Medical-Surgical

## 2024-11-02 ENCOUNTER — Other Ambulatory Visit: Payer: Self-pay

## 2024-11-02 ENCOUNTER — Telehealth: Payer: Self-pay | Admitting: *Deleted

## 2024-11-02 DIAGNOSIS — N951 Menopausal and female climacteric states: Secondary | ICD-10-CM

## 2024-11-02 MED ORDER — ESTRADIOL 0.05 MG/24HR TD PTTW: 1.0000 | MEDICATED_PATCH | TRANSDERMAL | 0 refills | Status: AC

## 2024-11-02 NOTE — Telephone Encounter (Signed)
-----   Message from Nurse Silvano SQUIBB, RN sent at 11/02/2024  8:44 AM EST ----- Regarding: FW: refill request for Vivelle -Dot Per Dr. Cleatus, patient needs appointment then can refill medication. Please let me know when scheduled. ----- Message ----- From: Cleatus Moccasin, MD Sent: 11/01/2024   9:20 PM EST To: Silvano LELON Piano, RN Subject: RE: refill request for Vivelle -Dot             We can refill through to appt but yes - should have appt ----- Message ----- From: Piano Silvano LELON, RN Sent: 11/01/2024   4:51 PM EST To: Moccasin Cleatus, MD Subject: refill request for Vivelle -Dot                 Received electronic request for refill of Vivelle -Dot, last seen 07/2023. Okay to send or does patient need appointment?

## 2024-11-02 NOTE — Telephone Encounter (Signed)
Left patient a message to call and schedule. 

## 2024-11-28 ENCOUNTER — Other Ambulatory Visit: Payer: Self-pay | Admitting: Obstetrics and Gynecology

## 2024-11-28 DIAGNOSIS — N951 Menopausal and female climacteric states: Secondary | ICD-10-CM

## 2024-12-05 ENCOUNTER — Ambulatory Visit: Admitting: Obstetrics and Gynecology

## 2025-01-04 ENCOUNTER — Ambulatory Visit: Admitting: Medical-Surgical
